# Patient Record
Sex: Male | Born: 1940 | Race: White | Hispanic: No | Marital: Married | Smoking: Former smoker
Health system: Southern US, Community
[De-identification: ages and names within clinical notes are randomized; demographics above are authoritative.]

## PROBLEM LIST (undated history)

## (undated) DIAGNOSIS — E119 Type 2 diabetes mellitus without complications: Secondary | ICD-10-CM

## (undated) DIAGNOSIS — J45909 Unspecified asthma, uncomplicated: Secondary | ICD-10-CM

## (undated) DIAGNOSIS — E785 Hyperlipidemia, unspecified: Secondary | ICD-10-CM

## (undated) DIAGNOSIS — G4730 Sleep apnea, unspecified: Secondary | ICD-10-CM

## (undated) DIAGNOSIS — G510 Bell's palsy: Secondary | ICD-10-CM

## (undated) DIAGNOSIS — N189 Chronic kidney disease, unspecified: Secondary | ICD-10-CM

## (undated) HISTORY — PX: ARTHROSCOPY KNEE W/ DRILLING: SUR92

---
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
## 2003-08-28 HISTORY — PX: SHOULDER ARTHROSCOPY: SHX128

## 2013-05-27 DIAGNOSIS — G510 Bell's palsy: Secondary | ICD-10-CM

## 2013-05-27 HISTORY — DX: Bell's palsy: G51.0

## 2013-05-28 ENCOUNTER — Other Ambulatory Visit: Payer: Self-pay | Admitting: Family Medicine

## 2013-06-18 HISTORY — PX: ORIF HIP FRACTURE: SHX2125

## 2013-09-27 HISTORY — PX: EYE SURGERY: SHX253

## 2013-11-06 ENCOUNTER — Encounter (HOSPITAL_COMMUNITY): Payer: Self-pay | Admitting: Pharmacy Technician

## 2013-11-08 NOTE — H&P (Signed)
TOTAL HIP REVISION ADMISSION H&P  Patient is admitted for left revision total hip arthroplasty.  Subjective:  Chief Complaint: Left hip pain s/p IM nailing  HPI: [name] [name], 73 y.o. male, history of a fall in October 2014. He sustained based on radiographs with him a comminuted proximal femur fracture that extended in the subtroch region. He was treated with an intramedullary nail device. He has been seen and evaluated. He presented to the office with persistent pain predominately over the lateral side of his hip as well as shortened left lower extremity as well as an externally rotated leg. He has difficulty with all movements and he is restraining from using narcotics. He requires them occasionally at night.  Exam finds he is in a wheelchair for office travel. He has a lot of tenderness to palpation over the lateral incision sites. There are no signs of infection on observation of the incisions. He has painful range of motion and activity with his left lower extremity.  After reviewing with [name] his complaints and examination radiographs were ordered today. 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 He at this point is very eager to proceed with surgical intervention. I would like to hold off on this about six weeks but nonetheless we will get him scheduled to convert him to a total hip arthroplasty. I discussed at length the complicating nature of this type of procedure. The potential affects of his outcome with mutual goals between the two of us with regards to pain control, improved function verses restoration of the leg lengths. Surgery will consist of removing his hip screw. He brought his operative report noting that this was a Stryker nail that was placed. We will have to have that instrumentation available to remove the nail followed by conversion. This will be done in a lateral position. He does have distal  interlocking screws. Questions encouraged, answered, and reviewed regarding all of the risks and benefits.   Risks, benefits and expectations were discussed with the patient.  Risks including but not limited to the risk of anesthesia, blood clots, nerve damage, blood vessel damage, failure of the prosthesis, infection and up to and including death.  Patient understand the risks, benefits and expectations and wishes to proceed with surgery.    D/C Plans:   Home with HHPT/SNF  Post-op Meds:    No Rx given  Tranexamic Acid:   To be given  Decadron:    Not to be given - DM  FYI:    Norco post-op  ASA post-op    Past Medical History  Diagnosis Date  . Hyperlipidemia   . Bell's palsy oct 2014    right side of face  . Asthma   . Sleep apnea     cannot tolerate cpap  . Diabetes mellitus without complication   . Chronic kidney disease     stage 3 chronic kidney disease followed by primary md for    Past Surgical History  Procedure Laterality Date  . Eye surgery Right feb 2015    cataract removed  . Orif hip fracture  Jun 18, 2013  . Hernia repair  1960's    multiple repair, abdominal and umbilical  . Arthroscopy knee w/ drilling Left yrs ago  . Shoulder arthroscopy Bilateral 2005    Allergies  Allergen Reactions  . Demerol [Meperidine] Nausea Only  . Latex Swelling and Rash    History  Substance Use Topics  . Smoking status:  Former Smoker    Types: Cigars    Quit date: 08/27/2001  . Smokeless tobacco: Never Used     Comment: cigars for 3 years  . Alcohol Use: Yes     Comment: very occasional     Review of Systems  Constitutional: Negative.   HENT: Negative.   Eyes: Negative.   Respiratory: Positive for cough.   Cardiovascular: Negative.   Gastrointestinal: Negative.   Genitourinary: Negative.   Musculoskeletal: Positive for joint pain.  Skin: Negative.   Neurological: Negative.   Endo/Heme/Allergies: Positive for environmental allergies.   Psychiatric/Behavioral: Negative.     Objective:  Physical Exam  Constitutional: He is oriented to person, place, and time. He appears well-developed and well-nourished.  HENT:  Head: Normocephalic and atraumatic.  Mouth/Throat: Oropharynx is clear and moist.  Eyes: Pupils are equal, round, and reactive to light.  Neck: Neck supple. No JVD present. No tracheal deviation present. No thyromegaly present.  Cardiovascular: Normal rate, regular rhythm and intact distal pulses.   Murmur heard. Respiratory: Effort normal and breath sounds normal. No stridor. No respiratory distress. He has no wheezes.  GI: Soft. There is no tenderness. There is no guarding.  Musculoskeletal:       Left hip: He exhibits decreased range of motion, decreased strength, tenderness and bony tenderness. He exhibits no swelling and no deformity.  Lymphadenopathy:    He has no cervical adenopathy.  Neurological: He is alert and oriented to person, place, and time.  Skin: Skin is warm and dry.  Psychiatric: He has a normal mood and affect.     Imaging Review:  Plain radiographs demonstrate moderate degenerative joint disease of the left hip(s). There is evidence of malunion / shortening through his comminuted fracture segment with compression through his lag screw. The bone quality appears to be good for age and reported activity level.  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  The patient history, physical examination, clinical judgement of the provider and imaging studies are consistent with end stage degenerative joint disease of the left hip(s), previous IM nailing. Revision total hip arthroplasty is deemed medically necessary. The treatment options including medical management, injection therapy, arthroscopy and arthroplasty were discussed at length. The risks and benefits of total hip arthroplasty were presented and reviewed. The risks due to aseptic loosening,  infection, stiffness, dislocation/subluxation,  thromboembolic complications and other imponderables were discussed.  The patient acknowledged the explanation, agreed to proceed with the plan and consent was signed. Patient is being admitted for inpatient treatment for surgery, pain control, PT, OT, prophylactic antibiotics, VTE prophylaxis, progressive ambulation and ADL's and discharge planning. The patient is planning to be discharged to skilled nursing facility / home.      Anastasio Auerbach Alleigh Mollica   PAC  11/08/2013, 3:25 PM

## 2013-11-10 ENCOUNTER — Encounter (HOSPITAL_COMMUNITY)
Admission: RE | Admit: 2013-11-10 | Discharge: 2013-11-10 | Disposition: A | Discharge status: Home / Self Care | Payer: Medicare Other | Source: Ambulatory Visit | Attending: Orthopedic Surgery | Admitting: Orthopedic Surgery

## 2013-11-10 ENCOUNTER — Other Ambulatory Visit (HOSPITAL_COMMUNITY): Payer: Self-pay | Admitting: Orthopedic Surgery

## 2013-11-10 ENCOUNTER — Encounter (HOSPITAL_COMMUNITY): Payer: Self-pay

## 2013-11-10 DIAGNOSIS — Z01812 Encounter for preprocedural laboratory examination: Secondary | ICD-10-CM | POA: Insufficient documentation

## 2013-11-10 HISTORY — DX: Chronic kidney disease, unspecified: N18.9

## 2013-11-10 HISTORY — DX: Bell's palsy: G51.0

## 2013-11-10 HISTORY — DX: Sleep apnea, unspecified: G47.30

## 2013-11-10 HISTORY — DX: Type 2 diabetes mellitus without complications: E11.9

## 2013-11-10 HISTORY — DX: Hyperlipidemia, unspecified: E78.5

## 2013-11-10 HISTORY — DX: Unspecified asthma, uncomplicated: J45.909

## 2013-11-10 LAB — CBC
HCT: 42 % (ref 39.0–52.0)
Hemoglobin: 14.4 g/dL (ref 13.0–17.0)
MCH: 28.4 pg (ref 26.0–34.0)
MCHC: 34.3 g/dL (ref 30.0–36.0)
MCV: 82.8 fL (ref 78.0–100.0)
Platelets: 233 10*3/uL (ref 150–400)
RBC: 5.07 MIL/uL (ref 4.22–5.81)
RDW: 14.1 % (ref 11.5–15.5)
WBC: 8.4 10*3/uL (ref 4.0–10.5)

## 2013-11-10 LAB — BASIC METABOLIC PANEL
BUN: 22 mg/dL (ref 6–23)
CO2: 28 mEq/L (ref 19–32)
CREATININE: 1.11 mg/dL (ref 0.50–1.35)
Calcium: 10.2 mg/dL (ref 8.4–10.5)
Chloride: 101 mEq/L (ref 96–112)
GFR, EST AFRICAN AMERICAN: 75 mL/min — AB (ref >90)
GFR, EST NON AFRICAN AMERICAN: 64 mL/min — AB (ref >90)
Glucose, Bld: 114 mg/dL — ABNORMAL HIGH (ref 70–99)
Potassium: 4.8 mEq/L (ref 3.7–5.3)
Sodium: 140 mEq/L (ref 137–147)

## 2013-11-10 LAB — URINALYSIS, ROUTINE W REFLEX MICROSCOPIC
BILIRUBIN URINE: NEGATIVE
Glucose, UA: NEGATIVE mg/dL
Hgb urine dipstick: NEGATIVE
Ketones, ur: NEGATIVE mg/dL
Leukocytes, UA: NEGATIVE
NITRITE: NEGATIVE
PH: 5.5 (ref 5.0–8.0)
Protein, ur: NEGATIVE mg/dL
Specific Gravity, Urine: 1.012 (ref 1.005–1.030)
UROBILINOGEN UA: 0.2 mg/dL (ref 0.0–1.0)

## 2013-11-10 LAB — PROTIME-INR
INR: 0.96 (ref 0.00–1.49)
PROTHROMBIN TIME: 12.6 s (ref 11.6–15.2)

## 2013-11-10 LAB — SURGICAL PCR SCREEN
MRSA, PCR: NEGATIVE
Staphylococcus aureus: NEGATIVE

## 2013-11-10 LAB — APTT: aPTT: 39 seconds — ABNORMAL HIGH (ref 24–37)

## 2013-11-10 NOTE — Progress Notes (Signed)
ekg 06-17-13 [facility] on chart 1 view chest xray 06-17-13 [facility] on chart

## 2013-11-10 NOTE — Patient Instructions (Addendum)
20 [name] [name]  11/10/2013   Your procedure is scheduled on: Monday March 23rd  Report to [facility] at 530 AM.  Call this number if you have problems the morning of surgery [phone]   Remember:  Do not eat food or drink liquids :After Midnight.     Take these medicines the morning of surgery with A SIP OF WATER: TAKE 1/2 DOSE BEDTIME LANTUS ON 11-15-2013, DONAZEPIL, EYE DROP                                SEE [facility] PREPARING FOR SURGERY SHEET             You may not have any metal on your body including hair pins and piercings  Do not wear jewelry, make-up.  Do not wear lotions, powders, or perfumes. No  Deodorant is to be worn.   Men may shave face and neck.  Do not bring valuables to the hospital. [facility] IS NOT RESPONSIBLE FOR VALUEABLES.  Contacts, dentures or bridgework may not be worn into surgery.  Leave suitcase in the car. After surgery it may be brought to your room.  For patients admitted to the hospital, checkout time is 11:00 AM the day of discharge.    Please read over the following fact sheets that you were given: [facility] Preparing for surgery sheet,  incentive spirometer sheet, blood fact sheet, MRSA information  Call Cain SieveSharon Eshika Reckart RN pre op nurse if needed [phone]    FAILURE TO FOLLOW THESE INSTRUCTIONS MAY RESULT IN THE CANCELLATION OF YOUR SURGERY.  PATIENT SIGNATURE___________________________________________  NURSE SIGNATURE_____________________________________________

## 2013-11-10 NOTE — Progress Notes (Signed)
ekg 11-03-13 [facility] on chart

## 2013-11-12 NOTE — Progress Notes (Signed)
Medical clearance note dr Konrad Felixbrad  thomas  On chart

## 2013-11-16 ENCOUNTER — Inpatient Hospital Stay (HOSPITAL_COMMUNITY): Payer: Medicare Other | Admitting: Certified Registered Nurse Anesthetist

## 2013-11-16 ENCOUNTER — Encounter (HOSPITAL_COMMUNITY): Payer: Medicare Other | Admitting: Certified Registered Nurse Anesthetist

## 2013-11-16 ENCOUNTER — Encounter (HOSPITAL_COMMUNITY)
Admission: RE | Disposition: A | Discharge status: Home Health | Payer: Self-pay | Source: Ambulatory Visit | Attending: Orthopedic Surgery

## 2013-11-16 ENCOUNTER — Inpatient Hospital Stay (HOSPITAL_COMMUNITY): Payer: Medicare Other

## 2013-11-16 ENCOUNTER — Encounter (HOSPITAL_COMMUNITY): Payer: Self-pay

## 2013-11-16 ENCOUNTER — Inpatient Hospital Stay (HOSPITAL_COMMUNITY)
Admission: RE | Admit: 2013-11-16 | Discharge: 2013-11-18 | DRG: 470 | Disposition: A | Discharge status: Home Health | Payer: Medicare Other | Source: Ambulatory Visit | Attending: Orthopedic Surgery | Admitting: Orthopedic Surgery

## 2013-11-16 DIAGNOSIS — G4730 Sleep apnea, unspecified: Secondary | ICD-10-CM | POA: Diagnosis present

## 2013-11-16 DIAGNOSIS — N183 Chronic kidney disease, stage 3 unspecified: Secondary | ICD-10-CM | POA: Diagnosis present

## 2013-11-16 DIAGNOSIS — Y831 Surgical operation with implant of artificial internal device as the cause of abnormal reaction of the patient, or of later complication, without mention of misadventure at the time of the procedure: Secondary | ICD-10-CM | POA: Diagnosis present

## 2013-11-16 DIAGNOSIS — D500 Iron deficiency anemia secondary to blood loss (chronic): Secondary | ICD-10-CM | POA: Diagnosis not present

## 2013-11-16 DIAGNOSIS — J45909 Unspecified asthma, uncomplicated: Secondary | ICD-10-CM | POA: Diagnosis present

## 2013-11-16 DIAGNOSIS — Z87891 Personal history of nicotine dependence: Secondary | ICD-10-CM

## 2013-11-16 DIAGNOSIS — D62 Acute posthemorrhagic anemia: Secondary | ICD-10-CM | POA: Diagnosis not present

## 2013-11-16 DIAGNOSIS — Z6838 Body mass index (BMI) 38.0-38.9, adult: Secondary | ICD-10-CM

## 2013-11-16 DIAGNOSIS — E669 Obesity, unspecified: Secondary | ICD-10-CM | POA: Diagnosis present

## 2013-11-16 DIAGNOSIS — E119 Type 2 diabetes mellitus without complications: Secondary | ICD-10-CM | POA: Diagnosis present

## 2013-11-16 DIAGNOSIS — T84498A Other mechanical complication of other internal orthopedic devices, implants and grafts, initial encounter: Principal | ICD-10-CM | POA: Diagnosis present

## 2013-11-16 DIAGNOSIS — E785 Hyperlipidemia, unspecified: Secondary | ICD-10-CM | POA: Diagnosis present

## 2013-11-16 DIAGNOSIS — Z96649 Presence of unspecified artificial hip joint: Secondary | ICD-10-CM

## 2013-11-16 HISTORY — PX: CONVERSION TO TOTAL HIP: SHX5784

## 2013-11-16 LAB — GLUCOSE, CAPILLARY
GLUCOSE-CAPILLARY: 132 mg/dL — AB (ref 70–99)
GLUCOSE-CAPILLARY: 179 mg/dL — AB (ref 70–99)
Glucose-Capillary: 116 mg/dL — ABNORMAL HIGH (ref 70–99)
Glucose-Capillary: 230 mg/dL — ABNORMAL HIGH (ref 70–99)

## 2013-11-16 LAB — TYPE AND SCREEN
ABO/RH(D): A POS
Antibody Screen: NEGATIVE

## 2013-11-16 LAB — ABO/RH: ABO/RH(D): A POS

## 2013-11-16 SURGERY — CONVERSION, PREVIOUS HIP SURGERY, TO TOTAL HIP ARTHROPLASTY
Anesthesia: Spinal | Site: Hip | Laterality: Left

## 2013-11-16 MED ORDER — OXYCODONE HCL 5 MG PO TABS: 5.0000 mg | ORAL_TABLET | Freq: Once | ORAL | Status: DC | PRN

## 2013-11-16 MED ORDER — HYDROMORPHONE HCL PF 1 MG/ML IJ SOLN
INTRAMUSCULAR | Status: AC
  Filled 2013-11-16: qty 1

## 2013-11-16 MED ORDER — HYPROMELLOSE (GONIOSCOPIC) 2.5 % OP SOLN: 1.0000 [drp] | Freq: Four times a day (QID) | OPHTHALMIC | Status: DC

## 2013-11-16 MED ORDER — HYDROMORPHONE HCL PF 2 MG/ML IJ SOLN
INTRAMUSCULAR | Status: AC
  Filled 2013-11-16: qty 1

## 2013-11-16 MED ORDER — EPHEDRINE SULFATE 50 MG/ML IJ SOLN
INTRAMUSCULAR | Status: DC | PRN
  Administered 2013-11-16 (×2): 10 mg via INTRAVENOUS

## 2013-11-16 MED ORDER — METOCLOPRAMIDE HCL 5 MG/ML IJ SOLN
INTRAMUSCULAR | Status: AC
  Filled 2013-11-16: qty 2

## 2013-11-16 MED ORDER — ONDANSETRON HCL 4 MG/2ML IJ SOLN: 4.0000 mg | Freq: Four times a day (QID) | INTRAMUSCULAR | Status: DC | PRN

## 2013-11-16 MED ORDER — BISACODYL 10 MG RE SUPP: 10.0000 mg | Freq: Every day | RECTAL | Status: DC | PRN

## 2013-11-16 MED ORDER — PROPOFOL INFUSION 10 MG/ML OPTIME
INTRAVENOUS | Status: DC | PRN
  Administered 2013-11-16: 25 ug/kg/min via INTRAVENOUS

## 2013-11-16 MED ORDER — OXYCODONE HCL 5 MG/5ML PO SOLN
5.0000 mg | Freq: Once | ORAL | Status: DC | PRN
  Filled 2013-11-16: qty 5

## 2013-11-16 MED ORDER — FERROUS SULFATE 325 (65 FE) MG PO TABS
325.0000 mg | ORAL_TABLET | Freq: Three times a day (TID) | ORAL | Status: DC
  Administered 2013-11-16 – 2013-11-18 (×5): 325 mg via ORAL
  Filled 2013-11-16 (×8): qty 1

## 2013-11-16 MED ORDER — ONDANSETRON HCL 4 MG/2ML IJ SOLN
INTRAMUSCULAR | Status: DC | PRN
Start: 2013-11-16 — End: 2013-11-16
  Administered 2013-11-16: 4 mg via INTRAVENOUS

## 2013-11-16 MED ORDER — PROPOFOL 10 MG/ML IV BOLUS
INTRAVENOUS | Status: AC
  Filled 2013-11-16: qty 20

## 2013-11-16 MED ORDER — HYDROMORPHONE HCL PF 1 MG/ML IJ SOLN
0.5000 mg | INTRAMUSCULAR | Status: DC | PRN
  Administered 2013-11-16 (×2): 2 mg via INTRAVENOUS
  Administered 2013-11-16 (×2): 1 mg via INTRAVENOUS
  Administered 2013-11-17: 2 mg via INTRAVENOUS
  Filled 2013-11-16: qty 1
  Filled 2013-11-16 (×2): qty 2
  Filled 2013-11-16: qty 1
  Filled 2013-11-16: qty 2

## 2013-11-16 MED ORDER — INSULIN ASPART 100 UNIT/ML ~~LOC~~ SOLN
4.0000 [IU] | Freq: Three times a day (TID) | SUBCUTANEOUS | Status: DC
  Administered 2013-11-17 (×2): 4 [IU] via SUBCUTANEOUS

## 2013-11-16 MED ORDER — METOCLOPRAMIDE HCL 5 MG/ML IJ SOLN: 5.0000 mg | Freq: Three times a day (TID) | INTRAMUSCULAR | Status: DC | PRN

## 2013-11-16 MED ORDER — DIAZEPAM 5 MG PO TABS
5.0000 mg | ORAL_TABLET | Freq: Three times a day (TID) | ORAL | Status: DC | PRN
  Administered 2013-11-16 – 2013-11-17 (×2): 10 mg via ORAL
  Filled 2013-11-16 (×2): qty 2

## 2013-11-16 MED ORDER — SODIUM CHLORIDE 0.9 % IV SOLN
100.0000 mL/h | INTRAVENOUS | Status: DC
  Administered 2013-11-16 – 2013-11-17 (×4): 100 mL/h via INTRAVENOUS
  Filled 2013-11-16 (×13): qty 1000

## 2013-11-16 MED ORDER — BUPIVACAINE HCL (PF) 0.5 % IJ SOLN
INTRAMUSCULAR | Status: DC | PRN
  Administered 2013-11-16: 3 mL

## 2013-11-16 MED ORDER — TRANEXAMIC ACID 100 MG/ML IV SOLN
1000.0000 mg | Freq: Once | INTRAVENOUS | Status: AC
  Administered 2013-11-16: 1000 mg via INTRAVENOUS
  Filled 2013-11-16: qty 10

## 2013-11-16 MED ORDER — EPHEDRINE SULFATE 50 MG/ML IJ SOLN
INTRAMUSCULAR | Status: AC
  Filled 2013-11-16: qty 1

## 2013-11-16 MED ORDER — PHENYLEPHRINE HCL 10 MG/ML IJ SOLN
INTRAMUSCULAR | Status: DC | PRN
  Administered 2013-11-16 (×4): 80 ug via INTRAVENOUS

## 2013-11-16 MED ORDER — INSULIN GLARGINE 100 UNIT/ML ~~LOC~~ SOLN
40.0000 [IU] | Freq: Every day | SUBCUTANEOUS | Status: DC
  Administered 2013-11-16: 20 [IU] via SUBCUTANEOUS
  Administered 2013-11-17: 40 [IU] via SUBCUTANEOUS
  Filled 2013-11-16 (×3): qty 0.4

## 2013-11-16 MED ORDER — DIPHENHYDRAMINE HCL 25 MG PO CAPS
25.0000 mg | ORAL_CAPSULE | Freq: Four times a day (QID) | ORAL | Status: DC | PRN
  Administered 2013-11-16 – 2013-11-17 (×2): 25 mg via ORAL
  Filled 2013-11-16 (×2): qty 1

## 2013-11-16 MED ORDER — LACTATED RINGERS IV SOLN
INTRAVENOUS | Status: DC | PRN
  Administered 2013-11-16 (×5): via INTRAVENOUS

## 2013-11-16 MED ORDER — 0.9 % SODIUM CHLORIDE (POUR BTL) OPTIME
TOPICAL | Status: DC | PRN
  Administered 2013-11-16 (×2): 1000 mL

## 2013-11-16 MED ORDER — ALUM & MAG HYDROXIDE-SIMETH 200-200-20 MG/5ML PO SUSP: 30.0000 mL | ORAL | Status: DC | PRN

## 2013-11-16 MED ORDER — METOCLOPRAMIDE HCL 10 MG PO TABS: 5.0000 mg | ORAL_TABLET | Freq: Three times a day (TID) | ORAL | Status: DC | PRN

## 2013-11-16 MED ORDER — KETAMINE HCL 10 MG/ML IJ SOLN
INTRAMUSCULAR | Status: AC
  Filled 2013-11-16: qty 1

## 2013-11-16 MED ORDER — CEFAZOLIN SODIUM-DEXTROSE 2-3 GM-% IV SOLR
2.0000 g | Freq: Four times a day (QID) | INTRAVENOUS | Status: AC
  Administered 2013-11-16 (×2): 2 g via INTRAVENOUS
  Filled 2013-11-16 (×2): qty 50

## 2013-11-16 MED ORDER — PHENYLEPHRINE HCL 10 MG/ML IJ SOLN
INTRAMUSCULAR | Status: AC
  Filled 2013-11-16: qty 2

## 2013-11-16 MED ORDER — PHENOL 1.4 % MT LIQD: 1.0000 | OROMUCOSAL | Status: DC | PRN

## 2013-11-16 MED ORDER — BUPIVACAINE HCL (PF) 0.5 % IJ SOLN
INTRAMUSCULAR | Status: AC
  Filled 2013-11-16: qty 30

## 2013-11-16 MED ORDER — MIDAZOLAM HCL 2 MG/2ML IJ SOLN
INTRAMUSCULAR | Status: AC
  Filled 2013-11-16: qty 2

## 2013-11-16 MED ORDER — ONDANSETRON HCL 4 MG/2ML IJ SOLN
INTRAMUSCULAR | Status: AC
  Filled 2013-11-16: qty 2

## 2013-11-16 MED ORDER — MAGNESIUM CITRATE PO SOLN: 1.0000 | Freq: Once | ORAL | Status: AC | PRN

## 2013-11-16 MED ORDER — SIMVASTATIN 20 MG PO TABS
20.0000 mg | ORAL_TABLET | Freq: Every day | ORAL | Status: DC
  Administered 2013-11-16 – 2013-11-17 (×2): 20 mg via ORAL
  Filled 2013-11-16 (×3): qty 1

## 2013-11-16 MED ORDER — MENTHOL 3 MG MT LOZG
1.0000 | LOZENGE | OROMUCOSAL | Status: DC | PRN
  Filled 2013-11-16: qty 9

## 2013-11-16 MED ORDER — PREDNISOLONE ACETATE 1 % OP SUSP
1.0000 [drp] | Freq: Two times a day (BID) | OPHTHALMIC | Status: DC
  Filled 2013-11-16: qty 1

## 2013-11-16 MED ORDER — CEFAZOLIN SODIUM-DEXTROSE 2-3 GM-% IV SOLR
INTRAVENOUS | Status: AC
  Filled 2013-11-16: qty 50

## 2013-11-16 MED ORDER — KETAMINE HCL 10 MG/ML IJ SOLN
INTRAMUSCULAR | Status: DC | PRN
  Administered 2013-11-16 (×2): 5 mg via INTRAVENOUS
  Administered 2013-11-16: 20 mg via INTRAVENOUS
  Administered 2013-11-16 (×3): 10 mg via INTRAVENOUS

## 2013-11-16 MED ORDER — PHENYLEPHRINE HCL 10 MG/ML IJ SOLN
20.0000 mg | INTRAVENOUS | Status: DC | PRN
  Administered 2013-11-16: 50 ug/min via INTRAVENOUS

## 2013-11-16 MED ORDER — FENTANYL CITRATE 0.05 MG/ML IJ SOLN
INTRAMUSCULAR | Status: AC
  Filled 2013-11-16: qty 5

## 2013-11-16 MED ORDER — HYDROMORPHONE HCL PF 1 MG/ML IJ SOLN
0.2500 mg | INTRAMUSCULAR | Status: DC | PRN
  Administered 2013-11-16 (×3): 0.5 mg via INTRAVENOUS

## 2013-11-16 MED ORDER — DIAZEPAM 5 MG/ML IJ SOLN
INTRAMUSCULAR | Status: AC
  Filled 2013-11-16: qty 2

## 2013-11-16 MED ORDER — POLYETHYLENE GLYCOL 3350 17 G PO PACK
17.0000 g | PACK | Freq: Two times a day (BID) | ORAL | Status: DC
  Administered 2013-11-16 – 2013-11-17 (×2): 17 g via ORAL

## 2013-11-16 MED ORDER — BRIMONIDINE TARTRATE 0.15 % OP SOLN
1.0000 [drp] | Freq: Two times a day (BID) | OPHTHALMIC | Status: DC
  Filled 2013-11-16: qty 5

## 2013-11-16 MED ORDER — METOCLOPRAMIDE HCL 5 MG/ML IJ SOLN
INTRAMUSCULAR | Status: DC | PRN
  Administered 2013-11-16: 10 mg via INTRAVENOUS

## 2013-11-16 MED ORDER — FENTANYL CITRATE 0.05 MG/ML IJ SOLN
INTRAMUSCULAR | Status: DC | PRN
  Administered 2013-11-16: 50 ug via INTRAVENOUS
  Administered 2013-11-16: 25 ug via INTRAVENOUS
  Administered 2013-11-16: 50 ug via INTRAVENOUS
  Administered 2013-11-16: 25 ug via INTRAVENOUS
  Administered 2013-11-16: 50 ug via INTRAVENOUS
  Administered 2013-11-16 (×2): 25 ug via INTRAVENOUS

## 2013-11-16 MED ORDER — HYDROCODONE-ACETAMINOPHEN 7.5-325 MG PO TABS
1.0000 | ORAL_TABLET | ORAL | Status: DC
  Administered 2013-11-16 – 2013-11-17 (×4): 2 via ORAL
  Filled 2013-11-16 (×4): qty 2

## 2013-11-16 MED ORDER — TRAZODONE HCL 50 MG PO TABS
50.0000 mg | ORAL_TABLET | Freq: Every day | ORAL | Status: DC
  Filled 2013-11-16 (×3): qty 1

## 2013-11-16 MED ORDER — DIAZEPAM 5 MG/ML IJ SOLN
10.0000 mg | Freq: Once | INTRAMUSCULAR | Status: AC
  Administered 2013-11-16: 10 mg via INTRAVENOUS

## 2013-11-16 MED ORDER — INSULIN ASPART 100 UNIT/ML ~~LOC~~ SOLN
0.0000 [IU] | Freq: Three times a day (TID) | SUBCUTANEOUS | Status: DC
  Administered 2013-11-16: 5 [IU] via SUBCUTANEOUS
  Administered 2013-11-17: 2 [IU] via SUBCUTANEOUS

## 2013-11-16 MED ORDER — CELECOXIB 200 MG PO CAPS
200.0000 mg | ORAL_CAPSULE | Freq: Two times a day (BID) | ORAL | Status: DC
  Administered 2013-11-16 – 2013-11-18 (×4): 200 mg via ORAL
  Filled 2013-11-16 (×5): qty 1

## 2013-11-16 MED ORDER — MIDAZOLAM HCL 5 MG/5ML IJ SOLN
INTRAMUSCULAR | Status: DC | PRN
  Administered 2013-11-16 (×2): 1 mg via INTRAVENOUS

## 2013-11-16 MED ORDER — PROMETHAZINE HCL 25 MG/ML IJ SOLN: 6.2500 mg | INTRAMUSCULAR | Status: DC | PRN

## 2013-11-16 MED ORDER — CEFAZOLIN SODIUM-DEXTROSE 2-3 GM-% IV SOLR
2.0000 g | INTRAVENOUS | Status: AC
  Administered 2013-11-16: 2 g via INTRAVENOUS

## 2013-11-16 MED ORDER — PHENYLEPHRINE 40 MCG/ML (10ML) SYRINGE FOR IV PUSH (FOR BLOOD PRESSURE SUPPORT)
PREFILLED_SYRINGE | INTRAVENOUS | Status: AC
  Filled 2013-11-16: qty 20

## 2013-11-16 MED ORDER — FUROSEMIDE 40 MG PO TABS
40.0000 mg | ORAL_TABLET | Freq: Every day | ORAL | Status: DC | PRN
  Administered 2013-11-17: 40 mg via ORAL
  Filled 2013-11-16 (×3): qty 1

## 2013-11-16 MED ORDER — DOCUSATE SODIUM 100 MG PO CAPS
100.0000 mg | ORAL_CAPSULE | Freq: Two times a day (BID) | ORAL | Status: DC
  Administered 2013-11-16 – 2013-11-18 (×4): 100 mg via ORAL

## 2013-11-16 MED ORDER — ONDANSETRON HCL 4 MG PO TABS: 4.0000 mg | ORAL_TABLET | Freq: Four times a day (QID) | ORAL | Status: DC | PRN

## 2013-11-16 MED ORDER — ASPIRIN EC 325 MG PO TBEC
325.0000 mg | DELAYED_RELEASE_TABLET | Freq: Two times a day (BID) | ORAL | Status: DC
  Administered 2013-11-17 – 2013-11-18 (×3): 325 mg via ORAL
  Filled 2013-11-16 (×5): qty 1

## 2013-11-16 MED ORDER — POLYVINYL ALCOHOL 1.4 % OP SOLN
1.0000 [drp] | Freq: Four times a day (QID) | OPHTHALMIC | Status: DC
  Administered 2013-11-16 (×2): 1 [drp] via OPHTHALMIC
  Filled 2013-11-16 (×2): qty 15

## 2013-11-16 MED ORDER — CITALOPRAM HYDROBROMIDE 20 MG PO TABS
20.0000 mg | ORAL_TABLET | Freq: Every evening | ORAL | Status: DC
  Administered 2013-11-16 – 2013-11-17 (×2): 20 mg via ORAL
  Filled 2013-11-16 (×3): qty 1

## 2013-11-16 MED ORDER — DONEPEZIL HCL 5 MG PO TABS
5.0000 mg | ORAL_TABLET | Freq: Every morning | ORAL | Status: DC
  Administered 2013-11-17 – 2013-11-18 (×2): 5 mg via ORAL
  Filled 2013-11-16 (×2): qty 1

## 2013-11-16 SURGICAL SUPPLY — 57 items
BAG ZIPLOCK 12X15 (MISCELLANEOUS) ×3 IMPLANT
BLADE SAW SGTL 18X1.27X75 (BLADE) ×2 IMPLANT
BLADE SAW SGTL 18X1.27X75MM (BLADE) ×1
BRUSH FEMORAL CANAL (MISCELLANEOUS) IMPLANT
CAPT HIP PF MOP ×3 IMPLANT
CATH FOLEY LATEX FREE 16FR (CATHETERS) ×3 IMPLANT
DERMABOND ADVANCED (GAUZE/BANDAGES/DRESSINGS) ×2
DERMABOND ADVANCED .7 DNX12 (GAUZE/BANDAGES/DRESSINGS) ×1 IMPLANT
DRAPE INCISE IOBAN 85X60 (DRAPES) ×6 IMPLANT
DRAPE ORTHO SPLIT 77X108 STRL (DRAPES) ×4
DRAPE POUCH INSTRU U-SHP 10X18 (DRAPES) ×3 IMPLANT
DRAPE SURG 17X11 SM STRL (DRAPES) ×3 IMPLANT
DRAPE SURG ORHT 6 SPLT 77X108 (DRAPES) ×2 IMPLANT
DRAPE U-SHAPE 47X51 STRL (DRAPES) ×3 IMPLANT
DRSG AQUACEL AG ADV 3.5X14 (GAUZE/BANDAGES/DRESSINGS) ×3 IMPLANT
DRSG EMULSION OIL 3X16 NADH (GAUZE/BANDAGES/DRESSINGS) IMPLANT
DRSG MEPILEX BORDER 4X4 (GAUZE/BANDAGES/DRESSINGS) IMPLANT
DRSG MEPILEX BORDER 4X8 (GAUZE/BANDAGES/DRESSINGS) IMPLANT
DURAPREP 26ML APPLICATOR (WOUND CARE) ×3 IMPLANT
ELECT BLADE TIP CTD 4 INCH (ELECTRODE) ×3 IMPLANT
ELECT REM PT RETURN 9FT ADLT (ELECTROSURGICAL) ×3
ELECTRODE REM PT RTRN 9FT ADLT (ELECTROSURGICAL) ×1 IMPLANT
EVACUATOR 1/8 PVC DRAIN (DRAIN) IMPLANT
FACESHIELD LNG OPTICON STERILE (SAFETY) ×12 IMPLANT
GLOVE BIOGEL PI IND STRL 7.5 (GLOVE) ×1 IMPLANT
GLOVE BIOGEL PI IND STRL 8 (GLOVE) ×1 IMPLANT
GLOVE BIOGEL PI INDICATOR 7.5 (GLOVE) ×2
GLOVE BIOGEL PI INDICATOR 8 (GLOVE) ×2
GLOVE ORTHO TXT STRL SZ7.5 (GLOVE) IMPLANT
GLOVE SURG ORTHO 8.0 STRL STRW (GLOVE) IMPLANT
GLOVE SURG SS PI 7.0 STRL IVOR (GLOVE) ×6 IMPLANT
GLOVE SURG SS PI 7.5 STRL IVOR (GLOVE) ×9 IMPLANT
GLOVE SURG SS PI 8.5 STRL IVOR (GLOVE) ×2
GLOVE SURG SS PI 8.5 STRL STRW (GLOVE) ×1 IMPLANT
GOWN SPEC L3 XXLG W/TWL (GOWN DISPOSABLE) ×6 IMPLANT
GOWN STRL REUS W/TWL LRG LVL3 (GOWN DISPOSABLE) ×9 IMPLANT
HANDPIECE INTERPULSE COAX TIP (DISPOSABLE)
KIT BASIN OR (CUSTOM PROCEDURE TRAY) ×3 IMPLANT
MANIFOLD NEPTUNE II (INSTRUMENTS) ×3 IMPLANT
NS IRRIG 1000ML POUR BTL (IV SOLUTION) ×6 IMPLANT
PACK TOTAL JOINT (CUSTOM PROCEDURE TRAY) ×3 IMPLANT
POSITIONER SURGICAL ARM (MISCELLANEOUS) ×3 IMPLANT
PRESSURIZER FEMORAL UNIV (MISCELLANEOUS) IMPLANT
SET HNDPC FAN SPRY TIP SCT (DISPOSABLE) IMPLANT
SPONGE LAP 18X18 X RAY DECT (DISPOSABLE) ×6 IMPLANT
SPONGE LAP 4X18 X RAY DECT (DISPOSABLE) IMPLANT
STAPLER VISISTAT 35W (STAPLE) ×3 IMPLANT
SUCTION FRAZIER TIP 10 FR DISP (SUCTIONS) ×3 IMPLANT
SUT MNCRL AB 3-0 PS2 18 (SUTURE) ×6 IMPLANT
SUT VIC AB 1 CT1 36 (SUTURE) ×21 IMPLANT
SUT VIC AB 2-0 CT1 27 (SUTURE) ×6
SUT VIC AB 2-0 CT1 TAPERPNT 27 (SUTURE) ×3 IMPLANT
SUT VLOC 180 0 24IN GS25 (SUTURE) ×6 IMPLANT
TOWEL OR 17X26 10 PK STRL BLUE (TOWEL DISPOSABLE) ×6 IMPLANT
TOWER CARTRIDGE SMART MIX (DISPOSABLE) IMPLANT
TRAY FOLEY CATH 14FRSI W/METER (CATHETERS) IMPLANT
WATER STERILE IRR 1500ML POUR (IV SOLUTION) ×6 IMPLANT

## 2013-11-16 NOTE — Transfer of Care (Signed)
Immediate Anesthesia Transfer of Care Note  Patient: [name] [name]  Procedure(s) Performed: Procedure(s): CONVERSION OF A FAILED LEFT TOTAL HIP TO A LEFT TOTAL HIP (Left)  Patient Location: PACU  Anesthesia Type:MAC and Spinal  Level of Consciousness: Patient easily awoken, sedated, comfortable, cooperative, following commands, responds to stimulation.   Airway & Oxygen Therapy: Patient spontaneously breathing, ventilating well, oxygen via simple oxygen mask.  Post-op Assessment: Report given to PACU RN, vital signs reviewed and stable, moving all extremities.   Post vital signs: Reviewed and stable.  Complications: No apparent anesthesia complications

## 2013-11-16 NOTE — Progress Notes (Signed)
CSW met with pt to assist with d/c planning. Pt is hoping to return home with [facility] services following hospital d/c. PT Eval and recommendations are pending. RNCM is aware.  CSW is available to assist with SNF placement if recommended and pt is in agreement with plan.  Werner Lean LCSW [phone]

## 2013-11-16 NOTE — Evaluation (Signed)
Physical Therapy Evaluation Patient Details Name: [name] [name] MRN: [id number] DOB: [birth year] Today's Date: 11/16/2013 Time: [id number] PT Time Calculation (min): 18 min  PT Assessment / Plan / Recommendation History of Present Illness  conversion from failed Left hip IM nail to THA  Clinical Impression  Pt will benefit from PT to address deficits below; plan is for home for HHPT    PT Assessment  Patient needs continued PT services    Follow Up Recommendations  Home health PT    Does the patient have the potential to tolerate intense rehabilitation      Barriers to Discharge        Equipment Recommendations  None recommended by PT    Recommendations for Other Services     Frequency 7X/week    Precautions / Restrictions Precautions Precautions: Posterior Hip Restrictions Other Position/Activity Restrictions: PWB 50%   Pertinent Vitals/Pain VSS; pain 10/10      Mobility  Bed Mobility Overal bed mobility: Needs Assistance Bed Mobility: Supine to Sit Supine to sit: Mod assist;HOB elevated General bed mobility comments: cues for technique, THP Transfers Overall transfer level: Needs assistance Equipment used: Rolling walker (2 wheeled) Transfers: Sit to/from Stand Sit to Stand: Min assist General transfer comment: cues for hand placement and LLE position Ambulation/Gait Ambulation/Gait assistance: Min assist Ambulation Distance (Feet): 15 Feet Assistive device: Rolling walker (2 wheeled) Gait Pattern/deviations: Step-to pattern;Antalgic General Gait Details: cues for sequence    Exercises Total Joint Exercises Ankle Circles/Pumps: AROM;Both;5 reps   PT Diagnosis: Difficulty walking  PT Problem List: Decreased strength;Decreased activity tolerance;Decreased balance;Decreased mobility;Decreased range of motion;Decreased knowledge of use of DME;Decreased knowledge of precautions PT Treatment Interventions: DME instruction;Gait training;Functional  mobility training;Therapeutic activities;Therapeutic exercise;Patient/family education;Stair training     PT Goals(Current goals can be found in the care plan section) Acute Rehab PT Goals Patient Stated Goal: I  PT Goal Formulation: With patient Time For Goal Achievement: 11/20/13 Potential to Achieve Goals: Good  Visit Information  Last PT Received On: 11/16/13 Assistance Needed: +1 History of Present Illness: conversion from failed Left hip IM nail to THA       Prior Functioning  Home Living Family/patient expects to be discharged to:: Private residence Living Arrangements: Spouse/significant other Type of Home: House Home Access: Stairs to enter Secretary/administratorntrance Stairs-Number of Steps: ramp  Home Layout: One level Alternate Level Stairs-Number of Steps: 3 steps  Home Equipment: Walker - 2 wheels;Bedside commode;Adaptive equipment Adaptive Equipment: Reacher;Long-handled shoe horn;Sock aid Prior Function Level of Independence: Needs assistance;Independent with assistive device(s) Gait / Transfers Assistance Needed: also using w/c ADL's / Homemaking Assistance Needed: assist with dressing  Communication Communication: No difficulties    Cognition  Cognition Arousal/Alertness: Awake/alert Behavior During Therapy: WFL for tasks assessed/performed Overall Cognitive Status: Within Functional Limits for tasks assessed    Extremity/Trunk Assessment Upper Extremity Assessment Upper Extremity Assessment: Overall WFL for tasks assessed Lower Extremity Assessment Lower Extremity Assessment: LLE deficits/detail LLE Deficits / Details: limited hip flexion, pt reports he was limited  prior to suergery for years LLE: Unable to fully assess due to pain   Balance    End of Session PT - End of Session Activity Tolerance: Patient tolerated treatment well Patient left: in chair;with call bell/phone within reach Nurse Communication: Mobility status  GP     [name] 11/16/2013, 4:19  PM

## 2013-11-16 NOTE — Interval H&P Note (Signed)
History and Physical Interval Note:  11/16/2013 7:12 AM  [name] [name]  has presented today for surgery, with the diagnosis of failed left hip surgery proximal femur status post ORIF  The various methods of treatment have been discussed with the patient and family. After consideration of risks, benefits and other options for treatment, the patient has consented to  Procedure(s): CONVERSION OF A FAILED LEFT TOTAL HIP TO A LEFT TOTAL HIP (Left) as a surgical intervention .  The patient's history has been reviewed, patient examined, no change in status, stable for surgery.  I have reviewed the patient's chart and labs.  Questions were answered to the patient's satisfaction.     Shelda PalLIN,Gaylynn Seiple D

## 2013-11-16 NOTE — Anesthesia Procedure Notes (Signed)
Spinal  Patient location during procedure: OR Start time: 11/16/2013 7:35 AM End time: 11/16/2013 7:40 AM Staffing Anesthesiologist: Nolon Nations R Performed by: anesthesiologist  Preanesthetic Checklist Completed: patient identified, site marked, surgical consent, pre-op evaluation, timeout performed, IV checked, risks and benefits discussed and monitors and equipment checked Spinal Block Patient position: sitting Prep: Betadine Patient monitoring: heart rate, continuous pulse ox and blood pressure Approach: right paramedian Location: L2-3 Injection technique: single-shot Needle Needle type: Quincke  Needle gauge: 22 G Needle length: 9 cm Assessment Sensory level: T8 Additional Notes Expiration date of kit checked and confirmed. Patient tolerated procedure well, without complications.

## 2013-11-16 NOTE — Progress Notes (Signed)
Utilization review completed.  

## 2013-11-16 NOTE — Brief Op Note (Signed)
11/16/2013  10:29 AM  PATIENT:  [name] [name]  73 y.o. male  PRE-OPERATIVE DIAGNOSIS:  Failed left hip surgery, prior ORIF of left proximal femur fracture  POST-OPERATIVE DIAGNOSIS: Failed left hip surgery, prior ORIF of left proximal femur fracture, malunion, shortening, pain  PROCEDURE:  Procedure(s): CONVERSION OF  FAILED LEFT hip surgery to Left  TOTAL HIP Arthroplasty Depuy  SURGEON:  Surgeon(s) and Role:    * Shelda PalMatthew D Treyana Sturgell, MD - Primary  PHYSICIAN ASSISTANT: Lanney GinsMatthew Babish, PA-C  ANESTHESIA:   general  EBL:  Total I/O In: 4000 [I.V.:4000] Out: 1800 [Urine:600; Blood:1200]  BLOOD ADMINISTERED:none  DRAINS: none   LOCAL MEDICATIONS USED:  NONE  SPECIMEN:  No Specimen  DISPOSITION OF SPECIMEN:  N/A  COUNTS:  YES  TOURNIQUET:  * No tourniquets in log *  DICTATION: .Other Dictation: Dictation Number [id number]  PLAN OF CARE: Admit to inpatient   PATIENT DISPOSITION:  PACU - hemodynamically stable.   Delay start of Pharmacological VTE agent (>24hrs) due to surgical blood loss or risk of bleeding: no

## 2013-11-16 NOTE — Anesthesia Preprocedure Evaluation (Signed)
Anesthesia Evaluation  Patient identified by MRN, date of birth, ID band Patient awake    Reviewed: Allergy & Precautions, H&P , NPO status , Patient's Chart, lab work & pertinent test results  Airway Mallampati: II TM Distance: >3 FB Neck ROM: Full    Dental  (+) Dental Advisory Given   Pulmonary asthma , sleep apnea , former smoker,          Cardiovascular negative cardio ROS  Rhythm:Regular Rate:Normal     Neuro/Psych negative neurological ROS  negative psych ROS   GI/Hepatic negative GI ROS, Neg liver ROS,   Endo/Other  diabetes, Type 2, Insulin Dependent  Renal/GU Renal disease     Musculoskeletal negative musculoskeletal ROS (+)   Abdominal (+) + obese,   Peds  Hematology negative hematology ROS (+)   Anesthesia Other Findings   Reproductive/Obstetrics                           Anesthesia Physical Anesthesia Plan  ASA: III  Anesthesia Plan: Spinal   Post-op Pain Management:    Induction:   Airway Management Planned:   Additional Equipment:   Intra-op Plan:   Post-operative Plan:   Informed Consent: I have reviewed the patients History and Physical, chart, labs and discussed the procedure including the risks, benefits and alternatives for the proposed anesthesia with the patient or authorized representative who has indicated his/her understanding and acceptance.   Dental advisory given  Plan Discussed with: CRNA  Anesthesia Plan Comments:         Anesthesia Quick Evaluation

## 2013-11-16 NOTE — Preoperative (Signed)
Beta Blockers   Reason not to administer Beta Blockers:Not Applicable, not on home BB 

## 2013-11-16 NOTE — Op Note (Signed)
NAME:  [name] [name], [name] [name] NO.:  [id number]  MEDICAL RECORD NO.:  [id number]  LOCATION:  1602                         FACILITY:  [facility]  PHYSICIAN:  Madlyn Frankel. Charlann Boxer, M.D.  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  PROCEDURE:  Conversion of failed left hip surgery to a left total hip arthroplasty.  Procedure consisted of removing a Stryker Gamma nail. The total hip arthroplasty components were Depuy size 15 large stature AML stem with a 36+ 1.5 metal ball, size 56 pinnacle shell with a 36+ 4 10-degree face changing liner to make with a 36 ball.  SURGEON:  Madlyn Frankel. Charlann Boxer, M.D.  ASSISTANT:  Lanney Gins, PA-C.  Note that Mr. [name] [name] was present for the entirety of the case from preoperative position, perioperative management of operative extremity, general facilitation of case and primary wound closure.  ANESTHESIA:  General.  SPECIMENS:  None, but the patient's intramedullary rod and screw were sent to be cleaned up and processed to get back to the patient.  DRAINS:  None.  BLOOD LOSS:  About 1200 mL.  COMPLICATION:  None apparent.  INDICATION FOR PROCEDURE:  Mr. [name] [name] is a 73 year old male who presented to the office for evaluation of left hip pain.  He had a history of a left proximal femur fracture.  It was treated with an open reduction and internal fixation.  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  He had because of this significant dysfunction and pain and was interested in proceeding with corrective surgery.  We discussed the risks involved with this including persistent pain, dysfunction due to the nature of the fracture itself, the difficulties in challenges in converting case of failed hip surgery to total hip arthroplasty.  Then, the risks of infection, DVT, component failure, need for future revision surgery and dislocation were all reviewed.  Consent was obtained for benefit of pain relief and improved functional outcome.  PROCEDURE IN DETAIL:  The patient was brought to the operative theater. Once adequate anesthesia, preoperative antibiotics, Ancef administered. The patient was positioned into the right lateral decubitus position with left side up.  The left lower extremity was then prepped and draped in sterile fashion.  Time-out was performed identifying the patient, planned procedure, and extremity.  The patient's old incisions were identified.  I utilized the primary incision for insertion of his nail and extended it proximally and posterior to allow for exposure of the posterior lateral aspect of the hip.  Soft tissue dissection was carried down to the iliotibial band and gluteal fascia.  I was able to readily palpate the lag screw laterally and inserted the lag screw screwdriver.  At this point, I  exposed the proximal femur laterally and identified the top portion of the nail, was confirmed based on radiographic evaluation. I removed the set screw inside the nail and then placed the nail extraction device onto the top of the nail.  With this in place, the lag screw was removed and then the nail was easily slapped out.  There was no distal interlocks were removed.  Once the nail was removed, there was prepared to be sent off for the patient to be processed and clean.  At this point, attention was now directed to the total  hip arthroplasty component.  As to be expected in this case, there was significant malunion of the proximal femur both with anterior and posterior displacement of the trochanteric fragments.  Initially, I went to expose the posterior aspect of the hip dissecting the soft tissues off the posterior trochanter fragments.  With the hip internally rotated, I was able to identify at least did the slight bit the junction between the trochanter and the femoral neck.  I then used the oscillating saw to remove this malunited trochanteric bone in better identified the neck.  At this point, I was able to dislocate the femoral head and performed a neck osteotomy.  At this point, attention was directed at debriding away some of the malunited bone to best identified the proximal femur.  This required dissection along the anterior aspect of the proximal femur around the lesser trochanter as well as around the greater trochanter anterior. Once this bone was removed and freed up, I placed the legs back into a neutral position, and attended to debridement and preparation of the acetabulum.  The femur was then retracted anteriorly with the retractors gently to prevent it further complications of the trochanter.  At this point, I was able to debride soft tissue and scarred in capsular tissue from the previous surgery around the acetabulum exposed this area quite well.  Once this area was exposed, I packed it with a sponge and return to the femur.  With the femur rotated, flexed and internally rotated.  I was able to open up the proximal femur with starting drill.  I then used a canal finder and hand reamer and was able to readily identify the canal of the femur without difficulty.  I began reaming for an AML stem with a size 10 reamer and reamed up to a 14.5 mm reamer with good bone chatter in the isthmus and the proximal aspect of femur.  Given this, we selected the size 15 stem.  I then broached at this  point, beginning with a 12 small body broach and broached up to the 15 small body broach and then the 15 large body noting that I did need to remove more anterior trochanteric bone as it was impinging on the appropriate anteversion of the femoral stem.  Once I did the broaching, I packed the femur and returned back to the acetabulum.  Acetabular preparation was carried out with the reaming beginning with a 49 mm reamer and reaming up to 55 mm reamer which was really the only reamer that got good bony contact circumferentially, but was all that necessary.  Following this reaming size 56 pinnacle cup was selected using the curved impactor.  It was felt that I was able to impact the cup with good secure rim fit.  The anterior rim of the cup was beneath the anterior wall anteriorly.  Portion of the cup was exposed superolaterally utilizing the cup positioning guide.  I  felt the abduction was about 40 degrees and anteversion was around 15-20 degrees. Two cancellous screws were placed for the initial fixation to support the initial fixation.  I did place a neutral trial liner and at this point did a trial reduction.  The size 15 large body broach was impacted back into position with approximately 20-25 degrees of anteversion with the trial reduction with 36.5 ball.  I recognized the few things, that we were very close to restoring his leg length due to the significant shortening had before.  It was also noted to be some persistent impingement of soft tissue and bone anteriorly.  My selection for the final components were thus made.  At this point, the final 36+ 4 10-degree face changing liner was selected to add support for any potential anterior impingement as well as the 15 large stature stem.  The trial components were removed.  The hole eliminator was placed in the acetabulum and the final 36+4 10-degree face changing liner was impacted with the lip portion at the approximate 2  o'clock for this left hip.  At this point, the further debridements of bone off the anterior aspect of trochanter from this malunion were removed freeing this up by palpation.  The final 15 large body stem was then impacted and was seated at the level where I had noted with my trial broaches.  Still I did do a repeat trial reduction 36 1.5 ball.  Here, I found that the impingement was anteriorly was improved.  There was no evidence of any subluxation with this current system in place, and again I felt that his leg lengths were nearly restored to equal as compared to his down leg.  The final 36+ 1.5 ball was selected and impacted over the clean and dry trunnion.  The hip was reduced.  The hip had been irrigated throughout the case again at this point.  I reapproximated the gluteal fascia from previous nail removal, back to the trochanter.  The iliotibial band was then reapproximated using #1 Vicryl and a 0 V- Loc.  The remaining wound was closed with 2-0 Vicryl and then a 3-0 Monocryl.  The hip wound was then cleaned, dried, and dressed sterilely using Dermabond and Aquacel dressing.  The patient was then brought to recovery room in stable condition, tolerating the procedure well.     Madlyn Frankel Charlann Boxer, M.D.     MDO/MEDQ  D:  11/16/2013  T:  11/16/2013  Job:  [id number]

## 2013-11-16 NOTE — Anesthesia Postprocedure Evaluation (Signed)
Anesthesia Post Note  Patient: [name] [name]  Procedure(s) Performed: Procedure(s) (LRB): CONVERSION OF A FAILED LEFT TOTAL HIP TO A LEFT TOTAL HIP (Left)  Anesthesia type: Spinal  Patient location: PACU  Post pain: Pain level controlled  Post assessment: Post-op Vital signs reviewed  Last Vitals: BP 140/74  Pulse 102  Temp(Src) 36.3 C (Oral)  Resp 16  SpO2 100%  Post vital signs: Reviewed  Level of consciousness: sedated  Complications: No apparent anesthesia complications

## 2013-11-17 LAB — BASIC METABOLIC PANEL
BUN: 21 mg/dL (ref 6–23)
CO2: 28 meq/L (ref 19–32)
Calcium: 8.3 mg/dL — ABNORMAL LOW (ref 8.4–10.5)
Chloride: 103 mEq/L (ref 96–112)
Creatinine, Ser: 1.68 mg/dL — ABNORMAL HIGH (ref 0.50–1.35)
GFR calc Af Amer: 45 mL/min — ABNORMAL LOW (ref >90)
GFR, EST NON AFRICAN AMERICAN: 39 mL/min — AB (ref >90)
GLUCOSE: 165 mg/dL — AB (ref 70–99)
POTASSIUM: 5.1 meq/L (ref 3.7–5.3)
SODIUM: 139 meq/L (ref 137–147)

## 2013-11-17 LAB — GLUCOSE, CAPILLARY
GLUCOSE-CAPILLARY: 139 mg/dL — AB (ref 70–99)
Glucose-Capillary: 96 mg/dL (ref 70–99)
Glucose-Capillary: 69 mg/dL — ABNORMAL LOW (ref 70–99)

## 2013-11-17 LAB — CBC
HEMATOCRIT: 26.5 % — AB (ref 39.0–52.0)
Hemoglobin: 9.1 g/dL — ABNORMAL LOW (ref 13.0–17.0)
MCH: 28.7 pg (ref 26.0–34.0)
MCHC: 34.3 g/dL (ref 30.0–36.0)
MCV: 83.6 fL (ref 78.0–100.0)
Platelets: 191 10*3/uL (ref 150–400)
RBC: 3.17 MIL/uL — ABNORMAL LOW (ref 4.22–5.81)
RDW: 14 % (ref 11.5–15.5)
WBC: 7.5 10*3/uL (ref 4.0–10.5)

## 2013-11-17 MED ORDER — SODIUM CHLORIDE 0.9 % IV BOLUS (SEPSIS)
250.0000 mL | Freq: Once | INTRAVENOUS | Status: AC
  Administered 2013-11-17: 250 mL via INTRAVENOUS

## 2013-11-17 MED ORDER — ASPIRIN 325 MG PO TBEC: 325.0000 mg | DELAYED_RELEASE_TABLET | Freq: Two times a day (BID) | ORAL | Status: DC

## 2013-11-17 MED ORDER — POLYETHYLENE GLYCOL 3350 17 G PO PACK: 17.0000 g | PACK | Freq: Two times a day (BID) | ORAL | Status: AC

## 2013-11-17 MED ORDER — OXYCODONE HCL 5 MG PO TABS: 5.0000 mg | ORAL_TABLET | ORAL | Status: DC

## 2013-11-17 MED ORDER — FERROUS SULFATE 325 (65 FE) MG PO TABS: 325.0000 mg | ORAL_TABLET | Freq: Three times a day (TID) | ORAL | Status: AC

## 2013-11-17 MED ORDER — DIAZEPAM 5 MG PO TABS: 5.0000 mg | ORAL_TABLET | Freq: Three times a day (TID) | ORAL | Status: DC | PRN

## 2013-11-17 MED ORDER — OXYCODONE HCL 5 MG PO TABS
5.0000 mg | ORAL_TABLET | ORAL | Status: DC
  Administered 2013-11-17 (×2): 10 mg via ORAL
  Administered 2013-11-17: 15 mg via ORAL
  Administered 2013-11-17 – 2013-11-18 (×4): 10 mg via ORAL
  Filled 2013-11-17 (×3): qty 2
  Filled 2013-11-17: qty 3
  Filled 2013-11-17: qty 2
  Filled 2013-11-17: qty 3
  Filled 2013-11-17 (×2): qty 2

## 2013-11-17 MED ORDER — DSS 100 MG PO CAPS: 100.0000 mg | ORAL_CAPSULE | Freq: Two times a day (BID) | ORAL | Status: AC

## 2013-11-17 MED ORDER — HYDROXYZINE HCL 50 MG PO TABS
50.0000 mg | ORAL_TABLET | Freq: Three times a day (TID) | ORAL | Status: DC | PRN
  Filled 2013-11-17: qty 1

## 2013-11-17 MED ORDER — HYDROXYZINE HCL 25 MG PO TABS: 25.0000 mg | ORAL_TABLET | Freq: Three times a day (TID) | ORAL | Status: DC | PRN

## 2013-11-17 NOTE — Progress Notes (Signed)
Physical Therapy Treatment Patient Details Name: [name] CrazeLawrence Judy MRN: [id number] DOB: 1941/08/05 Today's Date: 11/17/2013    History of Present Illness conversion from failed Left hip IM nail to THA    PT Comments    Progressing slowly;  Pt very groggy and mildly confused due to meds  Follow Up Recommendations  Home health PT     Equipment Recommendations  None recommended by PT    Recommendations for Other Services       Precautions / Restrictions Precautions Precautions: Posterior Hip;Fall Restrictions LLE Weight Bearing: Partial weight bearing LLE Partial Weight Bearing Percentage or Pounds: 50    Mobility  Bed Mobility                  Transfers Overall transfer level: Needs assistance Equipment used: Rolling walker (2 wheeled) Transfers: Sit to/from Stand Sit to Stand: Mod assist;Min assist         General transfer comment: cues for hand placement and LLE position  Ambulation/Gait Ambulation/Gait assistance: Min assist Ambulation Distance (Feet): 10 Feet (times 2 ) Assistive device: Rolling walker (2 wheeled) Gait Pattern/deviations: Step-to pattern;Trunk flexed;Antalgic     General Gait Details: cues for sequence   Stairs            Wheelchair Mobility    Modified Rankin (Stroke Patients Only)       Balance Overall balance assessment: Needs assistance           Standing balance-Leahy Scale: Poor Standing balance comment: pt attempting to sit without a chair behind him                    Cognition Arousal/Alertness: Awake/alert Behavior During Therapy: WFL for tasks assessed/performed Overall Cognitive Status: Within Functional Limits for tasks assessed                      Exercises Total Joint Exercises Ankle Circles/Pumps: AROM;Both;5 reps Quad Sets: AROM;10 reps    General Comments        Pertinent Vitals/Pain 12/10 pain left hip, ice to hip    Home Living Family/patient expects to be  discharged to:: Private residence Living Arrangements: Spouse/significant other           Home Equipment: Tub bench;Bedside commode      Prior Function Level of Independence: Needs assistance;Independent with assistive device(s)          PT Goals (current goals can now be found in the care plan section) Acute Rehab PT Goals Patient Stated Goal: decreased pain PT Goal Formulation: With patient Time For Goal Achievement: 11/20/13 Potential to Achieve Goals: Good Progress towards PT goals: Progressing toward goals    Frequency  7X/week    PT Plan Current plan remains appropriate    End of Session Equipment Utilized During Treatment: Gait belt Activity Tolerance: Patient tolerated treatment well Patient left: with call bell/phone within reach;in chair;with family/visitor present     Time: [id number] PT Time Calculation (min): 28 min  Charges:  $Gait Training: 23-37 mins                    G Codes:      Athziri Freundlich 11/17/2013, 12:48 PM

## 2013-11-17 NOTE — Progress Notes (Signed)
RN and NT attempted to stand patient x3 all before bladder scanning at 1720. Patient voided 10mL at 1715, and stated "I dont feel like I need to pee". Bladder scan then preformed.  RN then gave a prn order dose of 40 mg PO Lasix at 1742.    PA informed about bladder scan of 95mL, no crackles in lung bases or swelling of lower extremities.   PA then called about patient's inability to void.    PA informed RN to observe the patient, and continue to monitor for fluid overload, and diuresis from PO lasix given.

## 2013-11-17 NOTE — Progress Notes (Signed)
Hypoglycemic Event  CBG: 69  Treatment: 15 GM carbohydrate snack  Symptoms: Pale and Shaky  Follow-up CBG: Time:1720 CBG Result:75  (Not documented in the chart due to glucose monitor error)  Possible Reasons for Event: Other: 4 units of novolog given (meal coverage insulin).    Comments/MD notified:4 units of insulin given for meal coverage at 1230, patient ate 50% percent of his meal    Deneise LeverWhittemore, Tashonna Descoteaux J  Remember to initiate Hypoglycemia Order Set & complete

## 2013-11-17 NOTE — Progress Notes (Signed)
Patients scheduled eye drops are from a previous cataract surgery. Patient states he no longer uses these medications. Therefore these medication have been placed in the credit box so the patient will not be charged for these medications.

## 2013-11-17 NOTE — Progress Notes (Signed)
Patient ID: Jeanella CrazeLawrence Michalec, male   DOB: Sep 04, 1940, 73 y.o.   MRN: [id number] Subjective: 1 Day Post-Op Procedure(s) (LRB): CONVERSION OF A FAILED LEFT TOTAL HIP TO A LEFT TOTAL HIP (Left)    Patient reports pain as moderate.  Major issue is itching  Objective:   VITALS:   Filed Vitals:   11/17/13 0615  BP: 120/65  Pulse: 92  Temp: 98.3 F (36.8 C)  Resp: 16    Neurovascular intact Dorsiflexion/Plantar flexion intact Incision: dressing C/D/I  LABS  Recent Labs  11/17/13 0407  HGB 9.1*  HCT 26.5*  WBC 7.5  PLT 191     Recent Labs  11/17/13 0407  NA 139  K 5.1  BUN 21  CREATININE 1.68*  GLUCOSE 165*    No results found for this basename: LABPT, INR,  in the last 72 hours   Assessment/Plan: 1 Day Post-Op Procedure(s) (LRB): CONVERSION OF A FAILED LEFT TOTAL HIP TO A LEFT TOTAL HIP (Left)   Advance diet Up with therapy Discharge home with home health today if doing well or tomorrow if a bit slow with therapy

## 2013-11-17 NOTE — Progress Notes (Signed)
   CARE MANAGEMENT NOTE 11/17/2013  Patient:  [name] [name]   Account Number:  [id number]  Date Initiated:  11/17/2013  Documentation initiated by:  [facility]AV[facility],[name] Teodoro  Subjective/Objective Assessment:   CONVERSION OF A FAILED LEFT TOTAL HIP TO A LEFT TOTAL HIP     Action/Plan:   lives at home with wife   Anticipated DC Date:  11/18/2013   Anticipated DC Plan:  HOME W HOME HEALTH SERVICES      DC Planning Services  CM consult      [facility]   Choice offered to / List presented to:  C-1 Patient        HH arranged  HH-2 PT      [facility]  [facility]   Status of service:  Completed, signed off Medicare Important Message given?  YES (If response is "NO", the following Medicare IM given date fields will be blank) Date Medicare IM given:  11/16/2013 Date Additional Medicare IM given:    Discharge Disposition:  HOME W HOME HEALTH SERVICES  Per UR Regulation:  Reviewed for med. necessity/level of care/duration of stay  If discussed at Long Length of Stay Meetings, dates discussed:    Comments:  11/17/2013 1100 NCM spoke to pt and [facility] and requesting their services again. Has DME at home from previous surgery. Wife at home to assist with his care. [facility] and gave [facility]. Requested orders faxed to [phone]. Faxed orders, facesheet and [facility]P [facility]. They will plan a start of care for Thurs or Friday. [name] DonningAlesia Tiyana Galla RN CCM Case Mgmt phone [phone]

## 2013-11-17 NOTE — Progress Notes (Signed)
11/17/13 1403  PT Visit Information  Last PT Received On 11/17/13  PT Time Calculation  PT Start Time 1350  PT Stop Time 1402  PT Time Calculation (min) 12 min   Pt much more alert this pm compared to am; reports feeling better overall  Subjective Data  Patient Stated Goal decreased pain  Precautions  Precautions Posterior Hip;Fall  Restrictions  LLE Weight Bearing PWB  LLE Partial Weight Bearing Percentage or Pounds 50  Cognition  Arousal/Alertness Awake/alert  Behavior During Therapy WFL for tasks assessed/performed  Overall Cognitive Status Within Functional Limits for tasks assessed  Total Joint Exercises  Ankle Circles/Pumps AROM;Both;5 reps  [employer]Quad Sets AROM;10 reps  Short Arc Quad AROM;Left;10 reps;Strengthening  Heel Slides AROM;AAROM;Left;10 reps  Hip ABduction/ADduction AAROM;Left;10 reps  PT - End of Session  Activity Tolerance Patient tolerated treatment well  Patient left with call bell/phone within reach;with family/visitor present;in bed  Nurse Communication Mobility status  PT - Assessment/Plan  PT Plan Current plan remains appropriate  PT Frequency 7X/week  Follow Up Recommendations Home health PT  PT equipment None recommended by PT  PT Goal Progression  Progress towards PT goals Progressing toward goals  Acute Rehab PT Goals  Time For Goal Achievement 11/20/13  Potential to Achieve Goals Good  PT General Charges  $$ ACUTE PT VISIT 1 Procedure  PT Treatments  $Therapeutic Exercise 8-22 mins

## 2013-11-17 NOTE — Progress Notes (Signed)
Physical Therapy Treatment Patient Details Name: [name] [name] MRN: [id number] DOB: 1941-07-27 Today's Date: 11/17/2013    History of Present Illness conversion from failed Left hip IM nail to THA    PT Comments    POD # 1 pm session. Assisted pt OOb to amb limited distance in hallway then assisted back to bed.  Limited by pain despite pre medication.  Positioned to comfort and applied ICE.   Follow Up Recommendations  Home health PT     Equipment Recommendations  None recommended by PT    Recommendations for Other Services       Precautions / Restrictions Precautions Precautions: Posterior Hip;Fall Restrictions Weight Bearing Restrictions: Yes LLE Weight Bearing: Partial weight bearing LLE Partial Weight Bearing Percentage or Pounds: 50 Other Position/Activity Restrictions: PWB 50%    Mobility  Bed Mobility Overal bed mobility: Needs Assistance Bed Mobility: Supine to Sit;Sit to Supine     Supine to sit: Mod assist Sit to supine: Mod assist   General bed mobility comments: cues for technique, THP  Transfers Overall transfer level: Needs assistance Equipment used: Rolling walker (2 wheeled) Transfers: Sit to/from Stand Sit to Stand: Mod assist;Min assist         General transfer comment: cues for hand placement and LLE position  Ambulation/Gait Ambulation/Gait assistance: Min assist Ambulation Distance (Feet): 18 Feet Assistive device: Rolling walker (2 wheeled) Gait Pattern/deviations: Step-to pattern;Decreased stance time - left;Trunk flexed Gait velocity: decreased   General Gait Details: 50% VC's for proper walker to self distance to ensure 50% WB and safety with turns.   Stairs            Wheelchair Mobility    Modified Rankin (Stroke Patients Only)       Balance                                    Cognition Arousal/Alertness: Awake/alert Behavior During Therapy: WFL for tasks assessed/performed Overall  Cognitive Status: Within Functional Limits for tasks assessed                      Exercises Total Joint Exercises Ankle Circles/Pumps: AROM;Both;5 reps Quad Sets: AROM;10 reps Short Arc Quad: AROM;Left;10 reps;Strengthening Heel Slides: AROM;AAROM;Left;10 reps Hip ABduction/ADduction: AAROM;Left;10 reps    General Comments        Pertinent Vitals/Pain     Home Living                      Prior Function            PT Goals (current goals can now be found in the care plan section) Acute Rehab PT Goals Patient Stated Goal: decreased pain Time For Goal Achievement: 11/20/13 Potential to Achieve Goals: Good Progress towards PT goals: Progressing toward goals    Frequency  7X/week    PT Plan Current plan remains appropriate    End of Session Equipment Utilized During Treatment: Gait belt Activity Tolerance: Patient tolerated treatment well Patient left: in bed;with call bell/phone within reach     Time: 1410-1440 PT Time Calculation (min): 30 min  Charges:  $Gait Training: 8-22 mins $Therapeutic Exercise: 8-22 mins $Therapeutic Activity: 8-22 mins                    G Codes:      Felecia ShellingLori Unique Sillas  PTA [facility]  Acute  Rehab Pager  [phone] 

## 2013-11-17 NOTE — Evaluation (Signed)
Occupational Therapy Evaluation Patient Details Name: [name] [name] MRN: [id number] DOB: 1940-09-15 Today's Date: 11/17/2013    History of Present Illness conversion from failed Left hip IM nail to THA   Clinical Impression   Pt was admitted for the above surgery. He will benefit from skilled OT to increase safety and independence with ADLs while following thps (posterior).  Pt is PWB    Follow Up Recommendations  Home health OT;Supervision/Assistance - 24 hour    Equipment Recommendations  None recommended by OT    Recommendations for Other Services       Precautions / Restrictions Precautions Precautions: Posterior Hip Restrictions Weight Bearing Restrictions: Yes  LLE Weight Bearing: Partial weight bearing      Mobility Bed Mobility                  Transfers        Mod A for sit to stand with RW; pt has a tendency to lean posteriorly              Balance                                    ADL         Lower Body Bathing: Moderate assistance;Adhering to hip precautions;With adaptive equipment;Sit to/from stand Lower Body Dressing: Moderate assistance;With adaptive equipment;Adhering to back precautions;Sit to/from stand       Functional mobility during ADLs: Moderate assistance;Rolling walker General ADL Comments: pt has ae but he is not able to lift LLE to use it. Reviewed and used sock aid on RLE while explaining modification for L.   Reviewed modifications for thps.   Pt has a tendency to lean posteriorly.  Reviewed thps with pt and wife.  He will need reinforcement.  pt can perform UB adls with set up/supervision  Educated wife on need to lean backwards when using tub transfer bench due to thps.  Needed to block pt's LLE for sit to stand as he continued to try to bring it under him.  He learned "nose over toes" when standing and now cued for thps.  Pt nervous about reaching hands backwards. Extra time and cues needed for sit  to stand     Vision                     Perception     Praxis      Pertinent Vitals/Pain 12/10 L hip. Reapplied ice.  Pt states pain has been as high as 20     Hand Dominance     Extremity/Trunk Assessment Upper Extremity Assessment Upper Extremity Assessment: Overall WFL for tasks assessed           Communication Communication Communication: No difficulties   Cognition Arousal/Alertness: Awake/alert Behavior During Therapy: WFL for tasks assessed/performed Overall Cognitive Status:  (medication making pt sleepy:   tangential at times)                     General Comments       Exercises      Home Living Family/patient expects to be discharged to:: Private residence Living Arrangements: Spouse/significant other                 Bathroom Shower/Tub: Chief Strategy Officer: Standard     Home Equipment: Tub bench;Bedside commode Adaptive Equipment: Reacher;Long-handled shoe horn;Sock aid  Prior Functioning/Environment Level of Independence: Needs assistance;Independent with assistive device(s)             OT Diagnosis:     OT Problem List: Decreased strength;Decreased activity tolerance;Impaired balance (sitting and/or standing);Decreased knowledge of precautions;Pain   OT Treatment/Interventions: Self-care/ADL training;DME and/or AE instruction;Patient/family education    OT Goals(Current goals can be found in the care plan section) Acute Rehab OT Goals Patient Stated Goal: decreased pain OT Goal Formulation: With patient/family Time For Goal Achievement: 11/24/13 Potential to Achieve Goals: Good ADL Goals Pt Will Perform Grooming: with min guard assist;standing Pt Will Perform Lower Body Bathing: with min assist;sit to/from stand;with adaptive equipment Pt Will Transfer to Toilet: with min assist;bedside commode;ambulating Pt Will Perform Toileting - Clothing Manipulation and hygiene: with min guard  assist;sit to/from stand Additional ADL Goal #1: pt will recall 3/3 thps  OT Frequency: Min 2X/week   Barriers to D/C:            End of Session:    Activity Tolerance: Patient limited by fatigue;Patient limited by pain Patient left: in chair;with call bell/phone within reach;with family/visitor present   Time: [id number] OT Time Calculation (min): 28 min Charges:  OT General Charges $OT Visit: 1 Procedure OT Evaluation $Initial OT Evaluation Tier I: 1 Procedure OT Treatments $Self Care/Home Management : 8-22 mins $Therapeutic Activity: 8-22 mins G-Codes:    Jansen Goodpasture 11/17/2013, 10:43 AM Marica OtterMaryellen Brentt Fread, OTR/L [phone] 11/17/2013

## 2013-11-18 ENCOUNTER — Inpatient Hospital Stay (HOSPITAL_COMMUNITY): Payer: Medicare Other

## 2013-11-18 DIAGNOSIS — D500 Iron deficiency anemia secondary to blood loss (chronic): Secondary | ICD-10-CM | POA: Diagnosis not present

## 2013-11-18 DIAGNOSIS — E669 Obesity, unspecified: Secondary | ICD-10-CM | POA: Diagnosis present

## 2013-11-18 LAB — CBC
HCT: 21.9 % — ABNORMAL LOW (ref 39.0–52.0)
Hemoglobin: 7.4 g/dL — ABNORMAL LOW (ref 13.0–17.0)
MCH: 28.8 pg (ref 26.0–34.0)
MCHC: 33.8 g/dL (ref 30.0–36.0)
MCV: 85.2 fL (ref 78.0–100.0)
Platelets: 136 K/uL — ABNORMAL LOW (ref 150–400)
RBC: 2.57 MIL/uL — ABNORMAL LOW (ref 4.22–5.81)
RDW: 14.4 % (ref 11.5–15.5)
WBC: 7.2 K/uL (ref 4.0–10.5)

## 2013-11-18 LAB — BASIC METABOLIC PANEL WITH GFR
BUN: 27 mg/dL — ABNORMAL HIGH (ref 6–23)
CO2: 26 meq/L (ref 19–32)
Calcium: 8.3 mg/dL — ABNORMAL LOW (ref 8.4–10.5)
Chloride: 103 meq/L (ref 96–112)
Creatinine, Ser: 1.84 mg/dL — ABNORMAL HIGH (ref 0.50–1.35)
GFR calc Af Amer: 41 mL/min — ABNORMAL LOW
GFR calc non Af Amer: 35 mL/min — ABNORMAL LOW
Glucose, Bld: 106 mg/dL — ABNORMAL HIGH (ref 70–99)
Potassium: 4.7 meq/L (ref 3.7–5.3)
Sodium: 138 meq/L (ref 137–147)

## 2013-11-18 LAB — GLUCOSE, CAPILLARY
Glucose-Capillary: 104 mg/dL — ABNORMAL HIGH (ref 70–99)
Glucose-Capillary: 136 mg/dL — ABNORMAL HIGH (ref 70–99)
Glucose-Capillary: 126 mg/dL — ABNORMAL HIGH (ref 70–99)

## 2013-11-18 MED ORDER — OXYCODONE HCL 5 MG PO TABS: 5.0000 mg | ORAL_TABLET | ORAL | Status: AC

## 2013-11-18 MED ORDER — ASPIRIN 325 MG PO TBEC: 325.0000 mg | DELAYED_RELEASE_TABLET | Freq: Two times a day (BID) | ORAL | Status: AC

## 2013-11-18 MED ORDER — TIZANIDINE HCL 4 MG PO CAPS: 4.0000 mg | ORAL_CAPSULE | Freq: Three times a day (TID) | ORAL | Status: AC | PRN

## 2013-11-18 NOTE — Progress Notes (Signed)
OT Cancellation Note  Patient Details Name: [name] [name] MRN: [id number] DOB: 1940-09-21   Cancelled Treatment:    Reason Eval/Treat Not Completed: Other (comment).  Pt is discharging today.  He and wife feel comfortable with precautions and adls.  Will sign off in acute.   [name] 11/18/2013, 10:21 AM Marica OtterMaryellen Mehran Guderian, OTR/L [phone] 11/18/2013

## 2013-11-18 NOTE — Progress Notes (Signed)
   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 No events throughout the night. Urinating much better today. Feels that he is ready to be discharged home.   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 Follow up with OLIN,Anglea Gordner D in 2 weeks.  Contact information:  [facility] [address] [place] [id number] [phone]    Expected ABLA  Treated with iron and will observe  Obese (BMI 30-39.9) Estimated body mass index is 38.34 kg/(m^2) as calculated from the following:   Height as of this encounter: 5\' 5"  (1.651 m).   Weight as of this encounter: 104.5 kg (230 lb 6.1 oz). Patient also counseled that weight may inhibit the healing process Patient counseled that losing weight will help with future health issues      [name] AuerbachMatthew S. Nishka Heide   PAC  11/18/2013, 9:41 AM

## 2013-11-18 NOTE — Progress Notes (Signed)
Physical Therapy Treatment Patient Details Name: [name] CrazeLawrence [name] MRN: [id number] DOB: 1940-09-17 Today's Date: 11/18/2013    History of Present Illness conversion from failed Left hip IM nail to THA    PT Comments    POD # 2 pt dressed and eager to D/C to home.  Spouse present and practiced going up 2 steps forward with B rails.  Amb in hallway limited distance due to fatigue.  Pt aware of PWB and pt recalled 3/3 THP.     Follow Up Recommendations  Home health PT     Equipment Recommendations  None recommended by PT    Recommendations for Other Services       Precautions / Restrictions Precautions Precautions: Posterior Hip;Fall Restrictions Weight Bearing Restrictions: Yes LLE Weight Bearing: Partial weight bearing Other Position/Activity Restrictions: PWB 50%    Mobility  Bed Mobility               General bed mobility comments: Pt OOB in recliner  Transfers Overall transfer level: Needs assistance Equipment used: Rolling walker (2 wheeled) Transfers: Sit to/from Stand Sit to Stand: Supervision;Min guard         General transfer comment: pt impulsive.  25% VC's on safety   Ambulation/Gait Ambulation/Gait assistance: Supervision;Min guard Ambulation Distance (Feet): 25 Feet Assistive device: Rolling walker (2 wheeled) Gait Pattern/deviations: Step-to pattern;Decreased stance time - left;Trunk flexed Gait velocity: WFL   General Gait Details: 25% VC's for proper walker to self distance to ensure 50% WB and safety with turns.   Stairs Stairs: Yes Stairs assistance: Min guard Stair Management: Step to pattern;Two rails;Forwards Number of Stairs: 2 General stair comments: with spouse present for family education.  Excessive lean on B rails to ensiure PWB.  Wheelchair Mobility    Modified Rankin (Stroke Patients Only)       Balance                                    Cognition                             Exercises      General Comments        Pertinent Vitals/Pain     Home Living                      Prior Function            PT Goals (current goals can now be found in the care plan section) Progress towards PT goals: Progressing toward goals    Frequency  7X/week    PT Plan      End of Session Equipment Utilized During Treatment: Gait belt Activity Tolerance: Patient tolerated treatment well Patient left: in chair     Time: 0930-1000 PT Time Calculation (min): 30 min  Charges:  $Gait Training: 8-22 mins $Self Care/Home Management: 8-22                    G Codes:     Felecia ShellingLori Shawne Bulow  PTA [facility]  Acute  Rehab Pager      [phone]

## 2013-11-18 NOTE — Progress Notes (Signed)
RN reviewed discharge instructions with patient and family. All questions answered.  Paperwork and prescriptions given.   NT rolled patient down in wheelchair to family car.  

## 2013-11-20 NOTE — Discharge Summary (Signed)
Physician Discharge Summary  Patient ID: [name] [name] MRN: [id number] DOB/AGE: [birth year] 73 y.o.  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 Hyperlipidemia   . Bell's palsy oct 2014    right side of face  . Asthma   . Sleep apnea     cannot tolerate cpap  . Diabetes mellitus without complication   . Chronic kidney disease     stage 3 chronic kidney disease followed by primary md for    HPI: [name] [name], 73 y.o. male, history of a fall in October 2014. He sustained based on radiographs with him a comminuted proximal femur fracture that extended in the subtroch region. He was treated with an intramedullary nail device. He has been seen and evaluated. He presented to the office with persistent pain predominately over the lateral side of his hip as well as shortened left lower extremity as well as an externally rotated leg. He has difficulty with all movements and he is restraining from using narcotics. He requires them occasionally at night. Exam finds he is in a wheelchair for office travel. He has a lot of tenderness to palpation over the lateral incision sites. There are no signs of infection on observation of the incisions. He has painful range of motion and activity with his left lower extremity. After reviewing with Mr. [name] [name] his complaints and examination radiographs were ordered today. 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 He at this point is very eager to proceed with surgical intervention. I would like to hold off on this about six weeks but nonetheless we  will get him scheduled to convert him to a total hip arthroplasty. I discussed at length the complicating nature of this type of procedure. The potential affects of his outcome with mutual goals between the two of us with regards to pain control, improved function verses restoration of the leg lengths. Surgery will consist of removing his hip screw. He brought his operative report noting that this was a Stryker nail that was placed. We will have to have that instrumentation available to remove the nail followed by conversion. This will be done in a lateral position. He does have distal interlocking screws. Questions encouraged, answered, and reviewed regarding all of the risks and benefits. Risks, benefits and expectations were discussed with the patient. Risks including but not limited to the risk of anesthesia, blood clots, nerve damage, blood vessel damage, failure of the prosthesis, infection and up to and including death. Patient understand the risks, benefits and expectations and wishes to proceed with surgery.  PCP: [name] FelixHOMAS,BRAD, MD   Discharged Condition: good  Hospital Course:  Patient underwent the above stated procedure on 11/16/2013. Patient tolerated the procedure well and brought to the recovery room in good condition and subsequently to the floor.  POD #1 BP: 120/65 ; Pulse: 92 ; Temp: 98.3 F (36.8 C) ; Resp: 16  Patient reports pain as moderate. Major issue is itching. Neurovascular intact, dorsiflexion/plantar flexion intact, incision: dressing C/D/I, no cellulitis present and compartment soft.   LABS  Basename  HGB  9.1  HCT  26.5   POD #2  BP: 147/70 ; Pulse: 83 ; Temp: 98 F (36.7 C) ; Resp: 16 Patient reports pain as mild, pain controlled better today. No events throughout the night. Urinating much better today. Feels that he is ready to be discharged home.  Neurovascular intact, dorsiflexion/plantar flexion intact, incision: dressing C/D/I, no cellulitis present and  compartment soft.   LABS  Basename    HGB  7.4  HCT  21.9    Discharge Exam: General appearance: alert, cooperative and no distress Extremities: Homans sign is negative, no sign of DVT, no edema, redness or tenderness in the calves or thighs and no ulcers, gangrene or trophic changes  Disposition:    Home-Health Care Svc with follow up in 2 weeks   Follow-up Information   Follow up with [name] Pal, MD. Schedule an appointment as soon as possible for a visit in 2 weeks.   Specialty:  Orthopedic Surgery   Contact information:   [address] [place] [address] [phone]       Follow [facility] with [facility]. [facility])    Specialty:  Home Health Services   Contact information:   [address] [place] [address] [phone]       Discharge Orders   Future Orders Complete By Expires   Call MD / Call 911  As directed    Comments:     If you experience chest pain or shortness of breath, CALL 911 and be transported to the hospital emergency room.  If you develope a fever above 101 F, pus (white drainage) or increased drainage or redness at the wound, or calf pain, call your surgeon's office.   Change dressing  As directed    Comments:     Maintain surgical dressing for 10-14 days, or until follow up in the clinic.   Constipation Prevention  As directed    Comments:     Drink plenty of fluids.  Prune juice may be helpful.  You may use a stool softener, such as Colace (over the counter) 100 mg twice a day.  Use MiraLax (over the counter) for constipation as needed.   Diet - low sodium heart healthy  As directed    Discharge instructions  As directed    Comments:     Maintain surgical dressing for 10-14 days, or until follow up in the clinic. Follow up in 2 weeks at [facility]. Call with any questions or concerns.   Driving restrictions  As directed    Comments:     No driving for 4 weeks   Partial  weight bearing  As directed    Questions:     % Body Weight:  50   Laterality:  left   Extremity:  Lower   TED hose  As directed    Comments:     Use stockings (TED hose) for 2 weeks on both leg(s).  You may remove them at night for sleeping.        Medication List    STOP taking these medications       HYDROcodone-acetaminophen 5-325 MG per tablet  Commonly known as:  NORCO/VICODIN      TAKE these medications       aspirin 325 MG EC tablet  Take 1 tablet (325 mg total) by mouth 2 (two) times daily.     brimonidine 0.1 % Soln  Commonly known as:  ALPHAGAN  P  Place 1 drop into the right eye 2 (two) times daily.     citalopram 20 MG tablet  Commonly known as:  CELEXA  Take 20 mg by mouth every evening.     donepezil 5 MG tablet  Commonly known as:  ARICEPT  Take 5 mg by mouth every morning.     DSS 100 MG Caps  Take 100 mg by mouth 2 (two) times daily.     ferrous sulfate 325 (65 FE) MG tablet  Take 1 tablet (325 mg total) by mouth 3 (three) times daily after meals.     Fish Oil 1000 MG Caps  Take by mouth daily. TAKES 45OO MG  TO 5000 MG PER DAY     furosemide 40 MG tablet  Commonly known as:  LASIX  Take 40 mg by mouth daily as needed for fluid.     GLUCOSAMINE CHONDROITIN JOINT PO  Take 1 tablet by mouth 2 (two) times daily.     hydroxypropyl methylcellulose 2.5 % ophthalmic solution  Commonly known as:  ISOPTO TEARS  Place 1 drop into both eyes 4 (four) times daily.     insulin aspart 100 UNIT/ML injection  Commonly known as:  novoLOG  Inject 4-25 Units into the skin 3 (three) times daily before meals.     insulin glargine 100 UNIT/ML injection  Commonly known as:  LANTUS  Inject 40 Units into the skin at bedtime.     lisinopril 20 MG tablet  Commonly known as:  PRINIVIL,ZESTRIL  Take 20 mg by mouth every morning.     multivitamin with minerals Tabs tablet  Take 1 tablet by mouth daily.     oxyCODONE 5 MG immediate release tablet  Commonly  known as:  Oxy IR/ROXICODONE  Take 1-3 tablets (5-15 mg total) by mouth every 4 (four) hours.     polyethylene glycol packet  Commonly known as:  MIRALAX / GLYCOLAX  Take 17 g by mouth 2 (two) times daily.     pravastatin 40 MG tablet  Commonly known as:  PRAVACHOL  Take 40 mg by mouth every morning.     prednisoLONE acetate 1 % ophthalmic suspension  Commonly known as:  PRED FORTE  Place 1 drop into the right eye 2 (two) times daily.     tiZANidine 4 MG capsule  Commonly known as:  ZANAFLEX  Take 1 capsule (4 mg total) by mouth 3 (three) times daily as needed for muscle spasms.     traZODone 50 MG tablet  Commonly known as:  DESYREL  Take 50 mg by mouth at bedtime.     Vitamin D (Ergocalciferol) 50000 UNITS Caps capsule  Commonly known as:  DRISDOL  Take 50,000 Units by mouth every 7 (seven) days.         Signed: Anastasio Auerbach. Janavia Rottman   PAC  11/20/2013, 7:46 AM

## 2013-12-03 ENCOUNTER — Ambulatory Visit (HOSPITAL_COMMUNITY)
Admission: RE | Admit: 2013-12-03 | Discharge: 2013-12-03 | Disposition: A | Discharge status: Home / Self Care | Payer: Medicare Other | Source: Ambulatory Visit | Attending: Cardiovascular Disease | Admitting: Cardiovascular Disease

## 2013-12-03 ENCOUNTER — Other Ambulatory Visit (HOSPITAL_COMMUNITY): Payer: Self-pay | Admitting: Orthopedic Surgery

## 2013-12-03 DIAGNOSIS — I82409 Acute embolism and thrombosis of unspecified deep veins of unspecified lower extremity: Secondary | ICD-10-CM

## 2013-12-03 NOTE — Progress Notes (Signed)
Left Venous Duplex Completed. Negative for DVT and SVT. Brianna L Mazza,RVT

## 2014-02-03 ENCOUNTER — Ambulatory Visit (HOSPITAL_BASED_OUTPATIENT_CLINIC_OR_DEPARTMENT_OTHER): Payer: Medicare Other | Attending: Physician Assistant | Admitting: Radiology

## 2014-02-03 VITALS — Ht 66.0 in | Wt 230.0 lb

## 2014-02-03 DIAGNOSIS — G4710 Hypersomnia, unspecified: Secondary | ICD-10-CM | POA: Insufficient documentation

## 2014-02-03 DIAGNOSIS — R5383 Other fatigue: Secondary | ICD-10-CM

## 2014-02-03 DIAGNOSIS — G4730 Sleep apnea, unspecified: Principal | ICD-10-CM

## 2014-02-03 DIAGNOSIS — R0609 Other forms of dyspnea: Secondary | ICD-10-CM | POA: Insufficient documentation

## 2014-02-03 DIAGNOSIS — R0683 Snoring: Secondary | ICD-10-CM

## 2014-02-03 DIAGNOSIS — G4761 Periodic limb movement disorder: Secondary | ICD-10-CM | POA: Insufficient documentation

## 2014-02-03 DIAGNOSIS — I491 Atrial premature depolarization: Secondary | ICD-10-CM | POA: Insufficient documentation

## 2014-02-03 DIAGNOSIS — I4949 Other premature depolarization: Secondary | ICD-10-CM | POA: Insufficient documentation

## 2014-02-03 DIAGNOSIS — R0989 Other specified symptoms and signs involving the circulatory and respiratory systems: Secondary | ICD-10-CM | POA: Insufficient documentation

## 2014-02-06 DIAGNOSIS — G4730 Sleep apnea, unspecified: Secondary | ICD-10-CM

## 2014-02-06 DIAGNOSIS — G4710 Hypersomnia, unspecified: Secondary | ICD-10-CM

## 2014-02-06 NOTE — Sleep Study (Signed)
   NAME: [name] CrazeLawrence Seelman DATE OF BIRTH:  [birth year] MEDICAL RECORD NUMBER [id number]  LOCATION: [facility]  PHYSICIAN: Tildon Silveria D  DATE OF STUDY: 02/03/2014  SLEEP STUDY TYPE: Nocturnal Polysomnogram               REFERRING PHYSICIAN: Lysle PearlBlaylock, Justin, MD  INDICATION FOR STUDY: Hypersomnia with sleep apnea  EPWORTH SLEEPINESS SCORE:   2/24 HEIGHT: 5\' 6"  (167.6 cm)  WEIGHT: 230 lb (104.327 kg)    Body mass index is 37.14 kg/(m^2).  NECK SIZE: 16 in.  MEDICATIONS: Charted for review  SLEEP ARCHITECTURE: Total sleep time 340.5 minutes with sleep efficiency 84.4%. Stage I was 12.5%, stage II 73.9%, stage III absent, REM 13.7% of total sleep time. Sleep latency 19 minutes, REM latency 216.5 minutes, awake after sleep onset 44 minutes, arousal index 18, bedtime medication: Lantus, Benadryl, glucosamine/chondroitin  RESPIRATORY DATA: Apnea hypopnea index (AHI) 2.5 per hour. 14 total events scored including 5 central apneas and 9 hypopneas. Events were more common while supine. REM AHI 1.3 per hour. There were not enough events to meet protocol requirements for split CPAP titration.  OXYGEN DATA: Mild to moderate snoring with oxygen desaturation to a nadir of 89% and a mean oxygen saturation through the study of 93.2% on room air.  CARDIAC DATA: Sinus rhythm with PACs and PVCs  MOVEMENT/PARASOMNIA: 465 leg jerks were counted of which 54 worse associated with arousal or awakening for periodic limb movement with arousal index of 9.5 per hour. Bathroom x1. Patient slept in a recliner.  IMPRESSION/ RECOMMENDATION:   1) Occasional respiratory events with sleep disturbance, within normal limits. AHI 2.5 per hour (the normal range for adults is an AHI from 0-5 events per hour). Mild to moderate snoring with oxygen desaturation to a nadir of 89% and a mean saturation through the study at 93.2% on room air. 2) Significant Periodic Limb Movement Syndrome with sleep  disturbance. A total of 465 limb jerks were counted of which 54 worse associated with arousal or awakening for a periodic limb movement with arousal index of 9.5 per hour. Consider a trial of specific therapy such is Requip or Mirapex if clinically appropriate.   Signed Jetty Duhamellinton Etheline Geppert M.D. Waymon BudgeYOUNG,Aliceson Dolbow D Diplomate, American Board of Sleep Medicine  ELECTRONICALLY SIGNED ON:  02/06/2014, 12:45 PM [facility] PH: [phone]   FX: [phone] ACCREDITED BY THE AMERICAN ACADEMY OF SLEEP MEDICINE

## 2015-10-08 IMAGING — CR DG PORTABLE PELVIS
1 series · 1 of 1 positions shown · non-contrast
Comparison: 06/20/2013

CLINICAL DATA: Postop left hip

EXAM:
PORTABLE PELVIS 1-2 VIEWS

[AP]
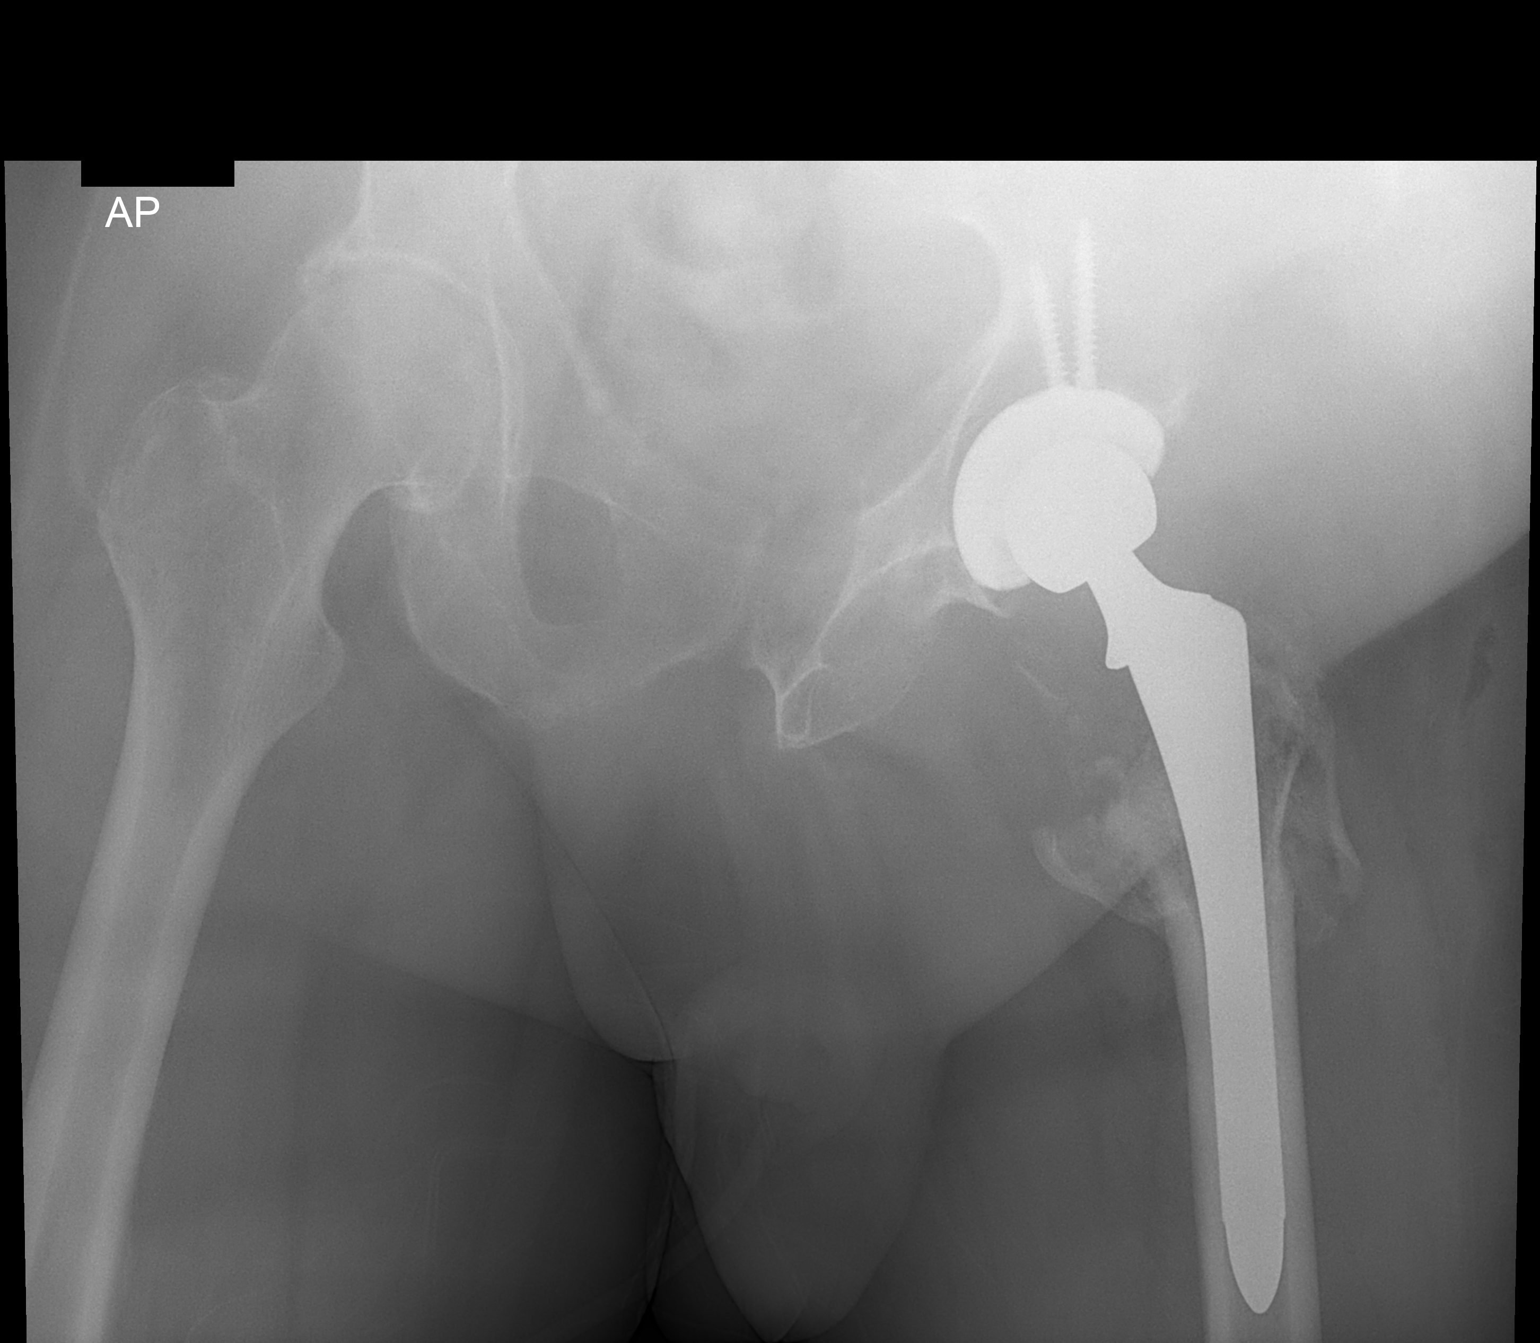

[1 of 1 positions shown; findings below may reference images not displayed]

FINDINGS: Left hip replacement in satisfactory position and alignment.
Interval removal of compression screw and intra medullary rod on the
left. There has been resection of the sub trochanteric femur and
femoral neck and femoral head. There is soft tissue ossification
related to prior fracture healing.
IMPRESSION: Left hip replacement and removal of compression screw and rod on the
left.

## 2015-10-10 IMAGING — CR DG CHEST 1V PORT
1 series · 1 of 1 positions shown · non-contrast
Comparison: Insert

CLINICAL DATA: Checking.

EXAM:
PORTABLE CHEST - 1 VIEW

[AP]
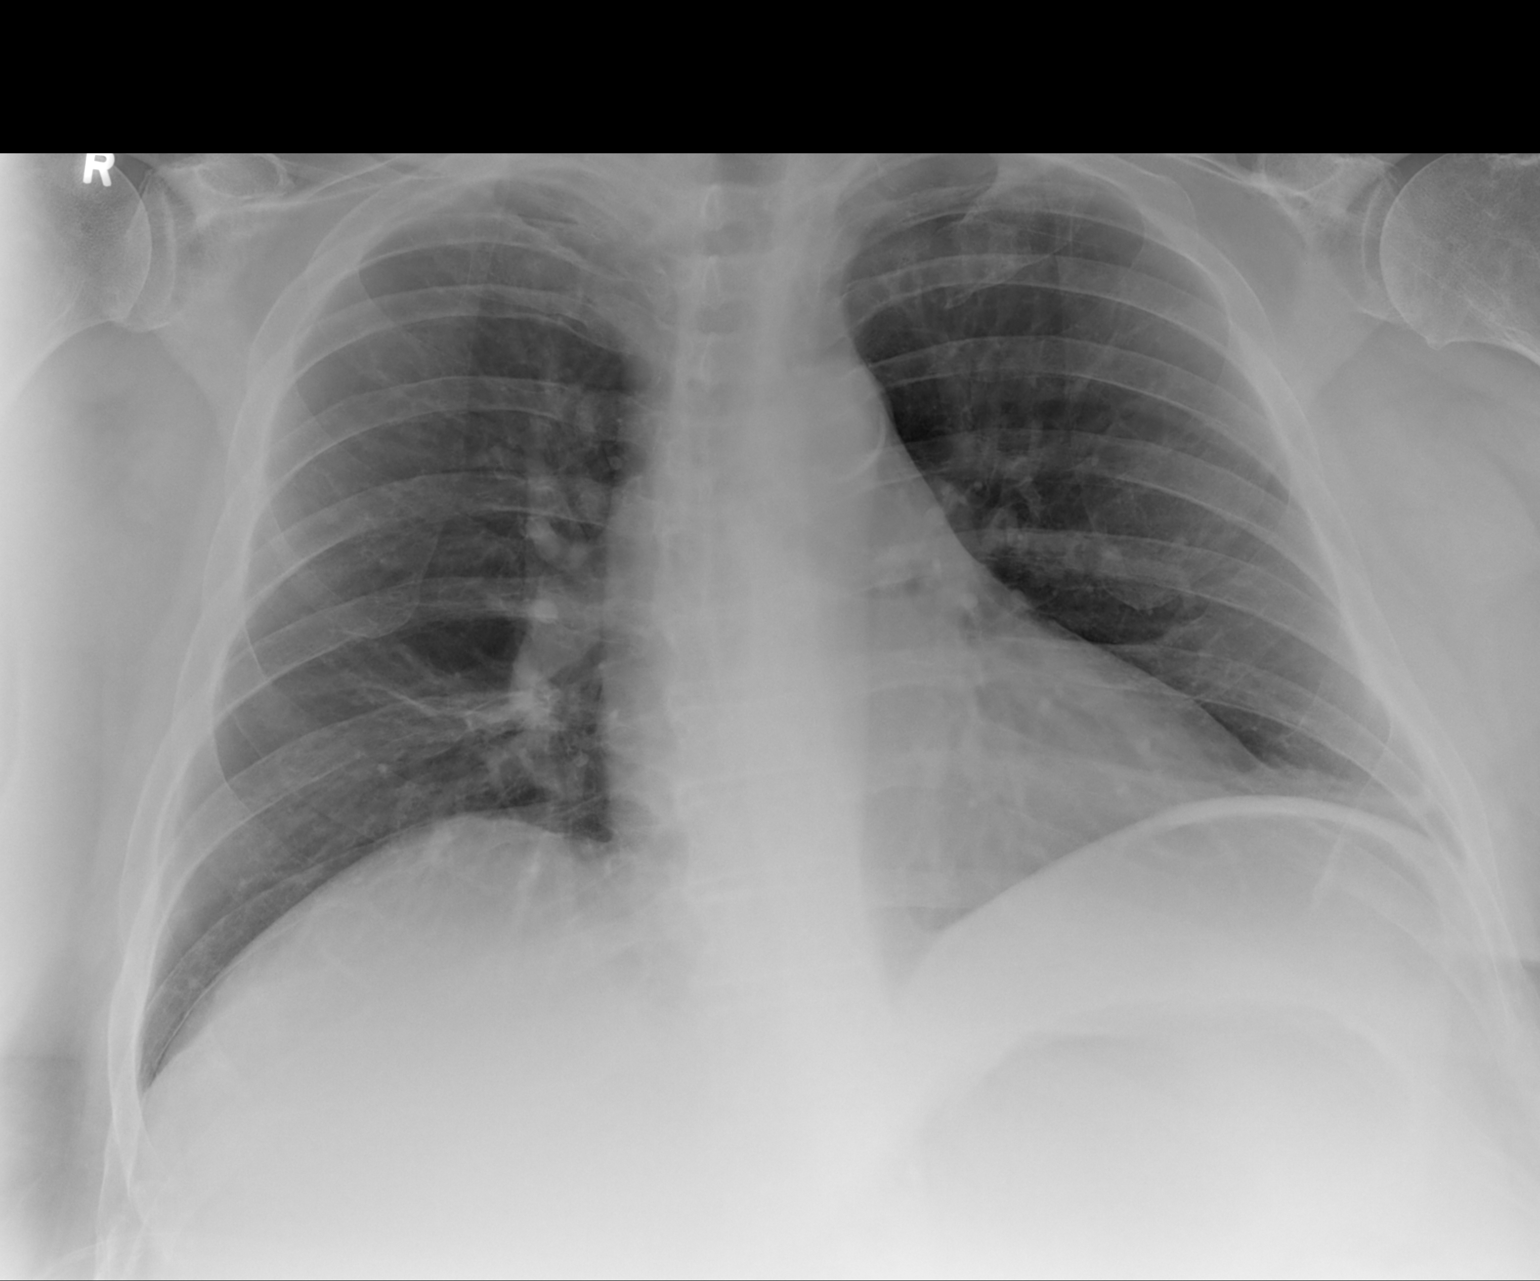

[1 of 1 positions shown; findings below may reference images not displayed]

FINDINGS: Mediastinum and hilar structures normal. Subsegmental atelectasis
lung bases. Cardiomegaly, normal pulmonary vascularity. No pleural
effusion or pneumothorax. Degenerative changes thoracic spine and
both shoulders.
IMPRESSION: 1.  Bibasilar subsegmental atelectasis.

2. Cardiomegaly, no CHF.

## 2017-12-06 ENCOUNTER — Encounter: Payer: Self-pay | Admitting: Gastroenterology
